# Patient Record
Sex: Female | Born: 1980 | Race: White | Hispanic: No | Marital: Single | Smoking: Never smoker
Health system: Southern US, Community
[De-identification: ages and names within clinical notes are randomized; demographics above are authoritative.]

## PROBLEM LIST (undated history)

## (undated) DIAGNOSIS — K219 Gastro-esophageal reflux disease without esophagitis: Secondary | ICD-10-CM

## (undated) HISTORY — PX: TUBAL LIGATION: SHX77

---

## 1999-03-25 ENCOUNTER — Emergency Department (HOSPITAL_COMMUNITY): Admission: EM | Admit: 1999-03-25 | Discharge: 1999-03-25 | Payer: Self-pay | Admitting: Emergency Medicine

## 1999-04-01 ENCOUNTER — Other Ambulatory Visit: Admission: RE | Admit: 1999-04-01 | Discharge: 1999-04-01 | Payer: Self-pay | Admitting: Obstetrics

## 1999-06-23 ENCOUNTER — Inpatient Hospital Stay (HOSPITAL_COMMUNITY): Admission: AD | Admit: 1999-06-23 | Discharge: 1999-06-23 | Payer: Self-pay | Admitting: Obstetrics

## 1999-09-02 ENCOUNTER — Inpatient Hospital Stay (HOSPITAL_COMMUNITY): Admission: AD | Admit: 1999-09-02 | Discharge: 1999-09-02 | Payer: Self-pay | Admitting: Obstetrics

## 1999-09-30 ENCOUNTER — Inpatient Hospital Stay (HOSPITAL_COMMUNITY): Admission: AD | Admit: 1999-09-30 | Discharge: 1999-10-03 | Payer: Self-pay | Admitting: Obstetrics

## 1999-09-30 ENCOUNTER — Inpatient Hospital Stay (HOSPITAL_COMMUNITY): Admission: AD | Admit: 1999-09-30 | Discharge: 1999-09-30 | Payer: Self-pay | Admitting: Obstetrics

## 2000-06-02 ENCOUNTER — Emergency Department (HOSPITAL_COMMUNITY): Admission: EM | Admit: 2000-06-02 | Discharge: 2000-06-02 | Payer: Self-pay | Admitting: Emergency Medicine

## 2000-06-12 ENCOUNTER — Emergency Department (HOSPITAL_COMMUNITY): Admission: EM | Admit: 2000-06-12 | Discharge: 2000-06-12 | Payer: Self-pay | Admitting: Emergency Medicine

## 2000-06-12 ENCOUNTER — Encounter: Payer: Self-pay | Admitting: Emergency Medicine

## 2000-09-11 ENCOUNTER — Emergency Department (HOSPITAL_COMMUNITY): Admission: EM | Admit: 2000-09-11 | Discharge: 2000-09-12 | Payer: Self-pay | Admitting: Emergency Medicine

## 2000-12-05 ENCOUNTER — Inpatient Hospital Stay (HOSPITAL_COMMUNITY): Admission: AD | Admit: 2000-12-05 | Discharge: 2000-12-05 | Payer: Self-pay | Admitting: Obstetrics

## 2001-01-24 ENCOUNTER — Emergency Department (HOSPITAL_COMMUNITY): Admission: EM | Admit: 2001-01-24 | Discharge: 2001-01-24 | Payer: Self-pay

## 2001-01-24 ENCOUNTER — Encounter: Payer: Self-pay | Admitting: Emergency Medicine

## 2001-02-15 ENCOUNTER — Ambulatory Visit: Admission: RE | Admit: 2001-02-15 | Discharge: 2001-02-15 | Payer: Self-pay | Admitting: Gynecologic Oncology

## 2001-03-17 ENCOUNTER — Emergency Department (HOSPITAL_COMMUNITY): Admission: EM | Admit: 2001-03-17 | Discharge: 2001-03-17 | Payer: Self-pay | Admitting: Emergency Medicine

## 2001-03-17 ENCOUNTER — Encounter: Payer: Self-pay | Admitting: *Deleted

## 2001-06-15 ENCOUNTER — Inpatient Hospital Stay (HOSPITAL_COMMUNITY): Admission: AD | Admit: 2001-06-15 | Discharge: 2001-06-15 | Payer: Self-pay | Admitting: Obstetrics

## 2001-06-26 ENCOUNTER — Inpatient Hospital Stay (HOSPITAL_COMMUNITY): Admission: AD | Admit: 2001-06-26 | Discharge: 2001-06-26 | Payer: Self-pay | Admitting: Obstetrics

## 2001-07-11 ENCOUNTER — Inpatient Hospital Stay (HOSPITAL_COMMUNITY): Admission: AD | Admit: 2001-07-11 | Discharge: 2001-07-11 | Payer: Self-pay | Admitting: Obstetrics

## 2001-07-13 ENCOUNTER — Inpatient Hospital Stay (HOSPITAL_COMMUNITY): Admission: AD | Admit: 2001-07-13 | Discharge: 2001-07-16 | Payer: Self-pay | Admitting: Obstetrics

## 2001-11-08 ENCOUNTER — Ambulatory Visit (HOSPITAL_COMMUNITY): Admission: RE | Admit: 2001-11-08 | Discharge: 2001-11-08 | Payer: Self-pay | Admitting: Obstetrics

## 2001-11-08 ENCOUNTER — Encounter (INDEPENDENT_AMBULATORY_CARE_PROVIDER_SITE_OTHER): Payer: Self-pay | Admitting: *Deleted

## 2003-02-21 ENCOUNTER — Inpatient Hospital Stay (HOSPITAL_COMMUNITY): Admission: AD | Admit: 2003-02-21 | Discharge: 2003-02-22 | Payer: Self-pay | Admitting: Obstetrics

## 2003-04-02 ENCOUNTER — Inpatient Hospital Stay (HOSPITAL_COMMUNITY): Admission: AD | Admit: 2003-04-02 | Discharge: 2003-04-02 | Payer: Self-pay | Admitting: Obstetrics

## 2003-04-15 ENCOUNTER — Inpatient Hospital Stay (HOSPITAL_COMMUNITY): Admission: AD | Admit: 2003-04-15 | Discharge: 2003-04-15 | Payer: Self-pay | Admitting: Obstetrics

## 2003-04-27 ENCOUNTER — Inpatient Hospital Stay (HOSPITAL_COMMUNITY): Admission: AD | Admit: 2003-04-27 | Discharge: 2003-04-28 | Payer: Self-pay | Admitting: Obstetrics

## 2003-05-09 ENCOUNTER — Inpatient Hospital Stay (HOSPITAL_COMMUNITY): Admission: AD | Admit: 2003-05-09 | Discharge: 2003-05-09 | Payer: Self-pay | Admitting: Obstetrics

## 2003-05-15 ENCOUNTER — Inpatient Hospital Stay (HOSPITAL_COMMUNITY): Admission: RE | Admit: 2003-05-15 | Discharge: 2003-05-18 | Payer: Self-pay | Admitting: Obstetrics

## 2004-05-06 ENCOUNTER — Emergency Department (HOSPITAL_COMMUNITY): Admission: EM | Admit: 2004-05-06 | Discharge: 2004-05-06 | Payer: Self-pay | Admitting: Emergency Medicine

## 2005-01-20 ENCOUNTER — Emergency Department (HOSPITAL_COMMUNITY): Admission: EM | Admit: 2005-01-20 | Discharge: 2005-01-20 | Payer: Self-pay | Admitting: Emergency Medicine

## 2005-02-06 ENCOUNTER — Ambulatory Visit: Payer: Self-pay | Admitting: Family Medicine

## 2006-07-29 DIAGNOSIS — R1013 Epigastric pain: Secondary | ICD-10-CM

## 2006-07-29 DIAGNOSIS — K3189 Other diseases of stomach and duodenum: Secondary | ICD-10-CM

## 2006-07-29 DIAGNOSIS — E669 Obesity, unspecified: Secondary | ICD-10-CM

## 2007-08-31 ENCOUNTER — Inpatient Hospital Stay (HOSPITAL_COMMUNITY): Admission: AD | Admit: 2007-08-31 | Discharge: 2007-08-31 | Payer: Self-pay | Admitting: Obstetrics

## 2007-09-12 ENCOUNTER — Inpatient Hospital Stay (HOSPITAL_COMMUNITY): Admission: RE | Admit: 2007-09-12 | Discharge: 2007-09-15 | Payer: Self-pay | Admitting: Obstetrics

## 2007-09-12 ENCOUNTER — Encounter (INDEPENDENT_AMBULATORY_CARE_PROVIDER_SITE_OTHER): Payer: Self-pay | Admitting: Obstetrics

## 2008-02-28 ENCOUNTER — Encounter (INDEPENDENT_AMBULATORY_CARE_PROVIDER_SITE_OTHER): Payer: Self-pay | Admitting: General Surgery

## 2008-02-28 ENCOUNTER — Ambulatory Visit (HOSPITAL_COMMUNITY): Admission: EM | Admit: 2008-02-28 | Discharge: 2008-02-29 | Payer: Self-pay | Admitting: Emergency Medicine

## 2009-11-25 ENCOUNTER — Inpatient Hospital Stay (HOSPITAL_COMMUNITY): Admission: RE | Admit: 2009-11-25 | Discharge: 2009-11-25 | Payer: Self-pay | Admitting: Obstetrics

## 2009-11-30 ENCOUNTER — Emergency Department (HOSPITAL_COMMUNITY): Admission: EM | Admit: 2009-11-30 | Discharge: 2009-11-30 | Payer: Self-pay | Admitting: Emergency Medicine

## 2010-08-18 LAB — GC/CHLAMYDIA PROBE AMP, GENITAL
Chlamydia, DNA Probe: NEGATIVE
GC Probe Amp, Genital: NEGATIVE

## 2010-08-18 LAB — URINALYSIS, ROUTINE W REFLEX MICROSCOPIC
Glucose, UA: NEGATIVE mg/dL
Ketones, ur: NEGATIVE mg/dL
Leukocytes, UA: NEGATIVE
Specific Gravity, Urine: >1.03 — ABNORMAL HIGH (ref 1.005–1.030)
Urobilinogen, UA: 0.2 mg/dL (ref 0.0–1.0)

## 2010-08-18 LAB — CBC
HCT: 37.9 % (ref 36.0–46.0)
Hemoglobin: 12.6 g/dL (ref 12.0–15.0)
MCH: 28.7 pg (ref 26.0–34.0)
MCHC: 33.3 g/dL (ref 30.0–36.0)
MCV: 86.2 fL (ref 78.0–100.0)
Platelets: 291 K/uL (ref 150–400)
RBC: 4.4 MIL/uL (ref 3.87–5.11)
RDW: 14.7 % (ref 11.5–15.5)
WBC: 6.8 K/uL (ref 4.0–10.5)

## 2010-08-18 LAB — URINE MICROSCOPIC-ADD ON

## 2010-08-18 LAB — WET PREP, GENITAL
Clue Cells Wet Prep HPF POC: NONE SEEN
Trich, Wet Prep: NONE SEEN
Yeast Wet Prep HPF POC: NONE SEEN

## 2010-08-18 LAB — POCT PREGNANCY, URINE: Preg Test, Ur: NEGATIVE

## 2010-10-14 NOTE — Op Note (Signed)
NAMEMARLAYSIA, Meredith Lucas           ACCOUNT NO.:  [id number]   MEDICAL RECORD NO.:  [id number]          PATIENT TYPE:  OBV   LOCATION:  1604                         FACILITY:  [facility]   PHYSICIAN:  Angelia Mould. Derrell Lolling, M.D.DATE OF BIRTH:  10-12-1980   DATE OF PROCEDURE:  02/28/2008  DATE OF DISCHARGE:                               OPERATIVE REPORT   PREOPERATIVE DIAGNOSES:  Acute cholecystitis with cholelithiasis.   POSTOPERATIVE DIAGNOSES:  Acute, edematous cholecystitis with  cholelithiasis.   OPERATION PERFORMED:  Laparoscopic cholecystectomy with intraoperative  cholangiogram.   SURGEON:  Dr. Claud Kelp.   FIRST ASSISTANT:  Dr. Glenna Fellows.   OPERATIVE INDICATIONS:  This is a 30 year old white female who presented  with a 24-hour history of back pain, epigastric pain, flank pain and  nausea.  She came to [facility] emergency room where an ultrasound was  obtained which showed gallstones and a thickened gallbladder wall.  She  had mild elevations of SGOT and SGPT.  I was asked to see her.  I found  that she had mild abdominal tenderness.  She wanted to come in and have  her gallbladder surgery at this time, and so she was admitted, given  intravenous antibiotics and brought to the operating room.   OPERATIVE FINDINGS:  The gallbladder was clearly edematous and acutely  inflamed, but there was no exudate, there was no gangrene.  The anatomy  of the cystic duct, cystic artery and common bile duct were normal.  The  cholangiogram was normal, showing normal intrahepatic and extrahepatic  bile ducts, no filling defect, and no obstruction with good flow of  contrast into the duodenum.  The liver, stomach, duodenum, colon and  small bowel looked normal to inspection.   OPERATIVE TECHNIQUE:  Following the induction of general endotracheal  anesthesia, the patient's abdomen was prepped and draped in a sterile  fashion.  The patient was identified as the correct patient and  correct  procedure.  Intravenous antibiotics had been given preop.  Marcaine 0.5%  with epinephrine was used as a local infiltration anesthetic.  A  vertically oriented incision was made inside the lower rim of the  umbilicus.  The fascia was incised in the midline.  The abdominal cavity  entered under direct vision.  An 11 mm Hassan trocar was inserted and  secured with a pursestring suture of 0 Vicryl.  Pneumoperitoneum was  created.  Video camera was inserted with visualization and findings as  described above.  An 11-mm trocar was placed in subxiphoid region and  two 5-mm trocars placed in the right upper quadrant.  The gallbladder  was identified and grasped.  We were able to retract the duodenum  inferiorly and identify the infundibulum of the gallbladder.  The  gallbladder was notable for edema, but very few adhesions.  We dissected  out the cystic duct and the cystic artery.  We were able to dissect out  a fairly significant length of cystic duct and have a good critical  view.  We inserted a cholangiogram catheter into the cystic duct and we  obtained a cholangiogram with the C-arm.  The  cholangiogram was normal  as described above.  The cholangiogram catheter was removed, the cystic  duct was secured with multiple metal clips and divided.  We isolated the  cystic artery as follows.  We found a common trunk which then branched  into an anterior branch and a posterior branch.  We could identify both  branches going onto the gallbladder, secured them with metal clips.  We  then clipped the proximal common branch and divided everything.  We then  dissected the gallbladder from its bed with electrocautery, placed it in  a specimen bag and removed it.  The operative field and the subphrenic  space were irrigated with about 1000 mL of saline.  There was no  bleeding and no bile whatsoever at the then of the case.  Irrigation  fluid was completely cleared and removed.  The trocars were  removed  under direct vision.  There was no bleeding from trocar sites.  The  pneumoperitoneum was released.  The fascia at the umbilicus was closed  with 0 Vicryl sutures.  The skin incisions were closed with subcuticular  sutures of 4-0 Monocryl and Steri-Strips.  Clean bandages were placed,  and the patient taken recovery room in stable condition.  Estimated  blood loss about 10 mL.  Complications none.  Sponge, needle and  instrument counts were correct.      Angelia Mould. Derrell Lolling, M.D.  Electronically Signed     HMI/MEDQ  D:  02/28/2008  T:  02/29/2008  Job:  [id number]

## 2010-10-14 NOTE — H&P (Signed)
Meredith Lucas, Meredith Lucas           ACCOUNT NO.:  [id number]   MEDICAL RECORD NO.:  [id number]          PATIENT TYPE:  OBV   LOCATION:  0105                         FACILITY:  [facility]   PHYSICIAN:  Angelia Mould. Derrell Lolling, M.D.DATE OF BIRTH:  16-Dec-1980   DATE OF ADMISSION:  02/28/2008  DATE OF DISCHARGE:                              HISTORY & PHYSICAL   CHIEF COMPLAINT:  Abdominal pain, nausea and gallstones.   HISTORY OF PRESENT ILLNESS:  This is a 30 year old white female who  states that yesterday she developed back pain and nausea but has not  vomited.  Late last night she developed epigastric pain radiating in the  right upper quadrant and left upper quadrant.  This would occur in ways  that would get worse and better but has continued to wax and wane to the  present time.  No prior episodes.  No fever.  Denies any history of  liver disease or jaundice.  Denies any history of cardiovascular or  pulmonary disease except for mild asthma as a child.  No urologic  history.   She was evaluated by Dr. Carleene Cooper in the emergency room.  A  gallbladder ultrasound shows tiny gallstones and some gallbladder wall  thickening at 3.4 mm but is otherwise unremarkable ultrasound.  Dr.  Ignacia Palma asked me to evaluate the patient.  The patient is interested in  having a cholecystectomy at this time and is being admitted for that  procedure.   PAST HISTORY:  Four pregnancies, four deliveries.  Three is cesarean  sections.  Otherwise no medical or surgical problems.   CURRENT MEDICATIONS:  None.   DRUG ALLERGIES:  None known.   SOCIAL HISTORY:  The patient lives in [place].  She is single.  Her  boyfriend is with her.  She has 1 of her babies with her.  She lives  with her boyfriend.  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  She denies alcohol or tobacco.   FAMILY HISTORY:  Father living with COPD,  hypertension, hyperlipidemia  and brown lung.  Mother living COPD and esophageal reflux disease and  has had a hiatal hernia surgery.  One brother who has psychiatric  problems and has COPD.  One sister living and well.   REVIEW OF SYSTEMS:  A 15-system review of systems is performed and is  noncontributory except as described above.   PHYSICAL EXAMINATION:  Obese white female in minimal distress, alert and  cooperative.  Temperature 97.3.  Pulse 89.  Respirations 18.  Blood pressure 112/65.  EYES:  Sclerae are clear.  Extraocular movements intact.  EARS, NOSE, MOUTH, THROAT:  Nose, lips, tongue and oropharynx without  gross lesions.  NECK:  Supple, nontender.  No mass.  No jugular distention.  LUNGS:  Clear to auscultation.  No chest wall tenderness.  HEART:  Regular rate and rhythm.  No murmur.  Radial and dorsalis pedis  pulses are palpable.  ABDOMEN:  Obese.  Soft.  Mildly tender in the right upper quadrant and  to a  lesser extent the epigastrium and left upper quadrant.  There is no  guarding or mass.  There is no hernia.  There is a well-healed  Pfannenstiel scar.  Liver and spleen not enlarged.  EXTREMITIES:  She moves all 4 extremities well without pain or  deformity.  NEUROLOGIC:  No gross motor sensory deficits.   ADMISSION DATA:  Ultrasound is described above showing gallstones and  gallbladder wall thickening.  Hemoglobin 12.5, white blood cell count  10,400, white blood cell count differential normal.  Liver function  tests showed SGOT elevated to 87 and SGPT borderline at 41, otherwise  liver function tests are normal.  Serum lipase is 21 which is normal.  Urine pregnancy test is negative.  Urinalysis is normal.   ASSESSMENT:  1. Acute and chronic cholecystitis with cholelithiasis.  I think she      has mild acute inflammation of the gallbladder and has continuing      to have pain and would benefit from cholecystectomy.  2. Obesity.  3. Multiparity.   PLAN:   1. The patient will be admitted to the hospital, started on IV fluids,      IV antibiotics and analgesics.  2. We will plan to take her to the operating room within the next 12-      24 hours for laparoscopic cholecystectomy.   I have discussed the indications and details of surgery with her.  Risks  and complications have been outlined, including but not limited to  bleeding, infection, conversion to open laparotomy, injury to adjacent  organs such as the intestine or bile duct with major reconstructive  surgery, bile leak, cardiac, pulmonary and thromboembolic problems.  She  seems to understand these issues well.  At this time all of her  questions are answered.  She is in full agreement with this plan.      Angelia Mould. Derrell Lolling, M.D.  Electronically Signed     HMI/MEDQ  D:  02/28/2008  T:  02/28/2008  Job:  [id number]   cc:   Kathreen Cosier, M.D.  Fax: [phone]

## 2010-10-14 NOTE — Op Note (Signed)
NAMEPRINCE, Meredith           ACCOUNT NO.:  [id number]   MEDICAL RECORD NO.:  [id number]          PATIENT TYPE:  INP   LOCATION:  9106                          FACILITY:  [facility]   PHYSICIAN:  Kathreen Cosier, M.D.DATE OF BIRTH:  [birth year]   DATE OF PROCEDURE:  09/12/2007  DATE OF DISCHARGE:                               OPERATIVE REPORT   PREOPERATIVE DIAGNOSIS:  Previous cesarean section x2 at term, desires  repeat and tubal ligation.   POSTOPERATIVE DIAGNOSIS:  Previous cesarean section x2 at term, desires  repeat and tubal ligation.   SURGEON:  Kathreen Cosier, M.D.   FIRST ASSISTANT:  Dr. Tamela Oddi.   PROCEDURE:  The patient placed in the operating table in supine position  after the spinal administered.  Abdomen prepped and draped.  Bladder  emptied with Foley catheter.  Transverse suprapubic incision made and  carried down to rectus fascia.  Fascia cleaned and incised the length of  the incision.  Recti muscles retracted laterally.  Peritoneum incised  longitudinally.  Transverse incision made above the visceral peritoneum  above the bladder.  Bladder mobilized inferiorly.  Transverse lower  uterine incision made.  The fluid was clear.  The patient delivered from  a female Apgar 9/9, weighing 7 pounds 9 ounces.  The team was in  attendance.  The placenta was anterior removed manually and sent to  labor and delivery.  Uterine cavity cleaned with dry laps.  The uterine  incision closed in one layer with continuous suture of #1 chromic.  Hemostasis satisfactory.  Bladder flap reattached with 2-0 chromic.  Uterus well contracted.  Tubes and ovaries normal.  The right tube  grasped in the midportion with a Babcock clamp.  0 plain suture placed  in the mesosalpinx below the portion of tube within the clamp.  This was  tied and approximately 1 inch of tube transected.  Procedure done in a  similar fashion the other side.  Lap and sponge counts correct.  Abdomen  closed in layers, peritoneum continuous suture of 0 chromic, fascia  continuous suture of 0 Dexon and the skin closed with subcuticular  stitch of 4-0 Monocryl.  Blood loss 700 mL.  The patient tolerated the  procedure well, taken to recovery room in good condition.           ______________________________  Kathreen Cosier, M.D.     BAM/MEDQ  D:  09/12/2007  T:  09/12/2007  Job:  [id number]

## 2010-10-17 NOTE — Discharge Summary (Signed)
[facility]  Patient:    Meredith Lucas, Meredith Lucas Visit Number: [id number] MRN: [id number]          Service Type: OBS Location: 910A 9134 01 Attending Physician:  Venita Sheffield Dictated by:   Kathreen Cosier, M.D. Admit Date:  07/13/2001 Discharge Date: 07/16/2001                             Discharge Summary  BRIEF HISTORY:                The patient is a 30 year old Gravida 2, Para 1, O-1, [facility] July 27, 2001, admitted in early labor with ruptured membranes at 1:30 p.m.  Exam suggested a breech, confirmed by ultrasound.  The patient was 3 cm long, footling breech, minus 3 station.  She was contracting and it was decided she would deliver by C-section.  She underwent a primary low transverse cesarean section delivering a female, Apgar 8, 9, weighing 6.5 pounds, double footling breech, anterior placenta.  On admission her hemoglobin was 9.9, postoperatively 8.1.  She did well postoperatively and was discharged home on the third postoperative day ambulatory, regular diet.  DISCHARGE DIAGNOSIS:          Status post intrauterine pregnancy at term, breech presentation, premature ruptured membranes labor, primary low transverse cesarean section. Dictated by:   Kathreen Cosier, M.D. Attending Physician:  Venita Sheffield DD:  07/16/01 TD:  07/16/01 Job: [id number]

## 2010-10-17 NOTE — Discharge Summary (Signed)
NAMEMarland Kitchen  MACLAINE, AHOLA                     ACCOUNT NO.:  [id number]   MEDICAL RECORD NO.:  [id number]                   PATIENT TYPE:  INP   LOCATION:  9112                                 FACILITY:  [facility]   PHYSICIAN:  Kathreen Cosier, M.D.           DATE OF BIRTH:  01-24-1981   DATE OF ADMISSION:  05/15/2003  DATE OF DISCHARGE:  05/18/2003                                 DISCHARGE SUMMARY   HOSPITAL COURSE:  The patient is a 30 year old female at term who had a  previous C-section and desired repeat.  She underwent a repeat low  transverse cesarean section and had a female Apgars 8 and 9 weighing 7  pounds.  Blood loss was 500 mL.  She did well.  Her RPR was negative.  On  admission her hemoglobin was 9.4, postop 7.3, platelets 243, 196 and  electrolytes normal.  She was discharged on the third postoperative day and  to return to her regular diet, Motrin liquid, and Depo-Provera 150 mg IM to  see me in six weeks.   DISCHARGE DIAGNOSIS:  Status post repeat low transverse cesarean section at  term.                                               Kathreen Cosier, M.D.    BAM/MEDQ  D:  05/18/2003  T:  05/18/2003  Job:  [id number]

## 2010-10-17 NOTE — Op Note (Signed)
[facility]  Patient:    Meredith Lucas, Meredith Lucas Visit Number: [id number] MRN: [id number]          Service Type: OBS Location: 910A 9134 01 Attending Physician:  Venita Sheffield Dictated by:   Kathreen Cosier, M.D. Proc. Date: 07/13/01 Admit Date:  07/13/2001 Discharge Date: 07/16/2001                             Operative Report  REDICTATION  PREOPERATIVE DIAGNOSES:       1. Intrauterine pregnancy at term, in labor.                               2. Ruptured membranes.                               3. Breech presentation.  POSTOPERATIVE DIAGNOSES:      1. Intrauterine pregnancy at term, in labor.                               2. Ruptured membranes.                               3. Breech presentation.  OPERATION:  SURGEON:                      Kathreen Cosier, M.D.  ANESTHESIA:                   Spinal.  DESCRIPTION OF PROCEDURE:     The patient placed on the operating table in the supine position after the spinal administered.  The abdomen prepped and draped.  The bladder emptied with a Foley catheter.  A transverse suprapubic incision was made and carried down to the rectus fascia.  The fascia cleaned and incised the length of the incision.  The recti muscles retracted laterally and the peritoneum incised longitudinally.  A transverse incision was made over the visceroperitoneum above the bladder and the bladder mobilized inferiorly.  A transverse lower uterine incision was made and the patient delivered of a female.  Apgar 8 and 9, weighing 6 pounds and 5 ounces.  Double footling breech presentation.  The team was in attendance.  The placenta was removed manually and the uterine cavity cleaned with dry laps.  The uterine incision closed in one layer with continuous suture of #1 chromic.  Hemostasis was satisfactory.  The bladder flap reattached with 2-0 chromic.  The uterus was well-contracted, tubes and ovaries normal.   The abdomen closed in layers. The peritoneum with continuous suture of 0 chromic.  The fascia continuous suture of 0 Dexon and the skin closed with subcuticular stitch of 3-0 Monocryl.  Blood loss was 450 cc. Dictated by:   Kathreen Cosier, M.D. Attending Physician:  Venita Sheffield DD:  08/03/01 TD:  08/03/01 Job: [id number]

## 2010-10-17 NOTE — Consult Note (Signed)
[facility]  Patient:    Meredith Lucas, Meredith Lucas Visit Number: [id number] MRN: [id number]          Service Type: GON Location: GYN Attending Physician:  Sabino Donovan Dictated by:   Jackquline Denmark. Kyla Balzarine, M.D. Proc. Date: 02/15/01 Admit Date:  02/15/2001   CC:         Kathreen Cosier, M.D.  Telford Nab, R.N.   Consultation Report  REASON FOR CONSULTATION:  Cervical intraepithelial neoplasia during pregnancy.  HISTORY OF PRESENT ILLNESS:  The patient is a 30 year old at approximately 15 weeks intrauterine pregnancy by ultrasound in July who had Pap smear suggesting CIN 2/HSIL at her first prenatal visit.  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  The patient has been entirely asymptomatic and denies abnormal Pap smears in the past.  She has a single sexual partner, is a para 1 0-0-1 and denies prior STD or condylomata other than treatment for chlamydia in the past.  She denies tobacco use.  PAST MEDICAL HISTORY:  No other comorbidities.  MEDICATIONS:  Prenatal vitamins.  ALLERGIES:  NONE.  PERSONAL/SOCIAL HISTORY:  Married, denies tobacco, rare ethanol.  FAMILY HISTORY:  Noncontributory.  REVIEW OF SYSTEMS:  As above.  PHYSICAL EXAMINATION:  VITAL SIGNS:  Weight 194 pounds.  GENERAL:  The patient is alert and oriented x 3 in no acute distress.  ABDOMEN:  Soft and benign with uterine fundus palpable with approximately three quarters the way between symphysis and umbilicus.  There is no ascites or mass and no abdominal tenderness.  PELVIC:  External genitalia and BUS are normal to inspection and palpation. The bladder and urethra are well supported.  Vagina is clear without lesions. The cervix is slightly cyanotic and everted without gross lesions.  Bimanual examination reveals normal cervical consistency in 16-17 week size uterus without adnexal pathology.  PROCEDURE:  Note after verbal informed consent,  colposcopy is obtained revealing abundant mucoid discharge from endocervical glands.  There is a modest amount of cervical eversion with acetowhite epithelium anteriorly and posteriorly with scattered punctation.  No atypical vessels are seen suggesting invasive disease.  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  RECOMMENDATIONS:  I had a lengthy discussion with the patient.  Her risk for malignant transformation or progression during this pregnancy is minimal.  I would recommend repeating Pap smears at approximately 24 and 32 weeks and would not intervene unless she has Pap smears suggesting cancer.  If she continues to have Pap smears suggesting CIN during pregnancy, I would reevaluate her at eight weeks postpartum with colposcopy and biopsies; this could be done under Dr. Tawny Hopping ________ and we could see her back at any time on a p.r.n. basis. Dictated by:   Jackquline Denmark. Kyla Balzarine, M.D. Attending Physician:  Ronita Hipps T DD:  02/15/01 TD:  02/15/01 Job: [id number]

## 2010-10-17 NOTE — Op Note (Signed)
NAMEJERALINE, Meredith Lucas                     ACCOUNT NO.:  [id number]   MEDICAL RECORD NO.:  [id number]                   PATIENT TYPE:  INP   LOCATION:  9112                                 FACILITY:  [facility]   PHYSICIAN:  Kathreen Cosier, M.D.           DATE OF BIRTH:  [birth year]   DATE OF PROCEDURE:  05/15/2003  DATE OF DISCHARGE:                                 OPERATIVE REPORT   PREOPERATIVE DIAGNOSIS:  Previous cesarean section at term, desires repeat.   ANESTHESIA:  Spinal.   SURGEON:  Kathreen Cosier, M.D.   FIRST ASSISTANT:  Charles A. Clearance Coots, M.D.   DESCRIPTION OF PROCEDURE:  The patient placed on the operating table in the  supine position.  Abdomen prepped and draped.  Bladder emptied with Foley  catheter.  A transverse suprapubic incision made through the old scar,  carried down to the rectus fascia.  Fascial tenon incised the length of the  incision.  Recti muscles were retracted laterally.  Peritoneum incised  longitudinally.  Transverse incision made in the visceral peritoneum above  the bladder. The bladder mobilized inferior.  A transverse lower uterine  incision made.  Fluid was clear.  Patient delivered from the OP position of  a female with Apgars 8 and 9 weighing 7 pounds.  Team was in attendance.  The placenta was posterior, removed manually with uterine cavity cleaned  with dry laps.  Uterine incision closed in one layer with continuous suture  of #1 chromic.  Fascia closed with continuous suture of 0 Dexon.  The skin  was closed with subcuticular stitch of 4-0 Monocryl.  Blood loss was 500 mL.  The patient tolerated the procedure well and was taken to the recovery room  in good condition.                                               Kathreen Cosier, M.D.    BAM/MEDQ  D:  05/15/2003  T:  05/15/2003  Job:  [id number]

## 2010-10-17 NOTE — Op Note (Signed)
[facility]  Patient:    Meredith Lucas, Meredith Lucas Visit Number: [id number] MRN: [id number]          Service Type: DSU Location: [facility] Attending Physician:  Venita Sheffield Dictated by:   Kathreen Cosier, M.D. Proc. Date: 11/08/01 Admit Date:  11/08/2001 Discharge Date: 11/08/2001                             Operative Report  PREOPERATIVE DIAGNOSES:       Severe dysplasia of the cervix.  PROCEDURE:                    Cold knife conization.  Under general anesthesia with patient in lithotomy position, perineum and vagina prepped and draped. Bladder emptied with straight catheter.  A suture of #1 figure-of-eight was placed at 3 and 9 oclock high upon the lateral aspect of the cervix for hemostasis.  Cold knife conization was done in the usual manner and then hemostasis was achieved with U sutures placed on the anterior and posterior aspects of the cervix.  The cervical canal was noted to be open.  The endometrial cavity sounded 8 cm.  Patient tolerated her procedure well.  Taken to recovery room in good condition. Dictated by:   Kathreen Cosier, M.D. Attending Physician:  Venita Sheffield DD:  11/08/01 TD:  11/09/01 Job: [id number]

## 2010-10-17 NOTE — Discharge Summary (Signed)
Meredith Lucas, Meredith Lucas           ACCOUNT NO.:  [id number]   MEDICAL RECORD NO.:  [id number]          PATIENT TYPE:  INP   LOCATION:  9103                          FACILITY:  [facility]   PHYSICIAN:  Kathreen Cosier, M.D.DATE OF BIRTH:  1980-06-27   DATE OF ADMISSION:  09/12/2007  DATE OF DISCHARGE:  09/15/2007                               DISCHARGE SUMMARY   The patient is a 30 year old gravida 4, para 3-0-0-3, [facility] September 19, 2007.  She had two previous C-sections and she is now at term and  desired repeat C-section, tubal ligation, negative HIV, and negative  GBS.   She was allergic to CODEINE and SULFA.   Negative family history.   The patient underwent a repeat low transverse cesarean section and tubal  ligation.  She had a female Apgar 9 and 9, weighing 7 pounds and 9  ounces.  Postoperatively, her hemoglobin was 8.2.  She was given ferrous  sulfate one p.o. b.i.d.  She did well and was discharged on the third  postoperative day, ambulatory on a regular diet on Tylox for pain and  Ferralet 90 one p.o. daily for her anemia.   DISCHARGE DIAGNOSIS:  Status post repeat low transverse cesarean section  and tubal ligation at term.           ______________________________  Kathreen Cosier, M.D.     BAM/MEDQ  D:  09/28/2007  T:  09/28/2007  Job:  [id number]

## 2011-02-13 ENCOUNTER — Emergency Department (HOSPITAL_COMMUNITY): Payer: Self-pay

## 2011-02-13 ENCOUNTER — Emergency Department (HOSPITAL_COMMUNITY)
Admission: EM | Admit: 2011-02-13 | Discharge: 2011-02-13 | Disposition: A | Discharge status: Home / Self Care | Payer: Self-pay | Attending: Emergency Medicine | Admitting: Emergency Medicine

## 2011-02-13 DIAGNOSIS — Z23 Encounter for immunization: Secondary | ICD-10-CM | POA: Insufficient documentation

## 2011-02-13 DIAGNOSIS — S6710XA Crushing injury of unspecified finger(s), initial encounter: Secondary | ICD-10-CM | POA: Insufficient documentation

## 2011-02-13 DIAGNOSIS — W230XXA Caught, crushed, jammed, or pinched between moving objects, initial encounter: Secondary | ICD-10-CM | POA: Insufficient documentation

## 2011-02-13 DIAGNOSIS — S6000XA Contusion of unspecified finger without damage to nail, initial encounter: Secondary | ICD-10-CM | POA: Insufficient documentation

## 2011-02-24 LAB — CBC
HCT: 29.3 — ABNORMAL LOW
MCV: 80.7
Platelets: 198
RBC: 3.02 — ABNORMAL LOW
RDW: 15

## 2011-02-24 LAB — CCBB MATERNAL DONOR DRAW

## 2011-03-02 LAB — COMPREHENSIVE METABOLIC PANEL
AST: 87 — ABNORMAL HIGH
Albumin: 3.4 — ABNORMAL LOW
Alkaline Phosphatase: 84
BUN: 8
CO2: 28
Calcium: 9.7
Chloride: 103
Creatinine, Ser: 0.68
GFR calc Af Amer: >60
GFR calc non Af Amer: >60
Glucose, Bld: 103 — ABNORMAL HIGH
Sodium: 139
Total Protein: 7.2

## 2011-03-02 LAB — URINE CULTURE

## 2011-03-02 LAB — URINALYSIS, ROUTINE W REFLEX MICROSCOPIC: Urobilinogen, UA: 0.2

## 2011-03-02 LAB — DIFFERENTIAL
Lymphocytes Relative: 17
Monocytes Absolute: 0.7
Monocytes Relative: 6

## 2011-03-02 LAB — LIPASE, BLOOD: Lipase: 21

## 2011-03-02 LAB — CBC
HCT: 37.9
Hemoglobin: 12.5
MCHC: 33
Platelets: 306
RBC: 4.71
RDW: 15.3

## 2012-09-19 ENCOUNTER — Emergency Department (HOSPITAL_COMMUNITY)
Admission: EM | Admit: 2012-09-19 | Discharge: 2012-09-19 | Disposition: A | Discharge status: Home / Self Care | Payer: No Typology Code available for payment source | Attending: Emergency Medicine | Admitting: Emergency Medicine

## 2012-09-19 DIAGNOSIS — M7918 Myalgia, other site: Secondary | ICD-10-CM

## 2012-09-19 DIAGNOSIS — S0993XA Unspecified injury of face, initial encounter: Secondary | ICD-10-CM | POA: Insufficient documentation

## 2012-09-19 DIAGNOSIS — Y92410 Unspecified street and highway as the place of occurrence of the external cause: Secondary | ICD-10-CM | POA: Insufficient documentation

## 2012-09-19 DIAGNOSIS — Y9389 Activity, other specified: Secondary | ICD-10-CM | POA: Insufficient documentation

## 2012-09-19 MED ORDER — TRAMADOL HCL 50 MG PO TABS: 50.0000 mg | ORAL_TABLET | Freq: Four times a day (QID) | ORAL | Status: DC | PRN
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
## 2012-09-20 NOTE — ED Provider Notes (Signed)
Medical screening examination/treatment/procedure(s) were performed by non-physician practitioner and as supervising physician I was immediately available for consultation/collaboration.    Celene Kras, MD 09/20/12 1101

## 2012-11-25 ENCOUNTER — Emergency Department (HOSPITAL_COMMUNITY)
Admission: EM | Admit: 2012-11-25 | Discharge: 2012-11-26 | Disposition: A | Discharge status: Home / Self Care | Payer: Medicaid Other | Attending: Emergency Medicine | Admitting: Emergency Medicine

## 2012-11-25 ENCOUNTER — Encounter (HOSPITAL_COMMUNITY): Payer: Self-pay | Admitting: Family Medicine

## 2012-11-25 DIAGNOSIS — R509 Fever, unspecified: Secondary | ICD-10-CM | POA: Insufficient documentation

## 2012-11-25 DIAGNOSIS — R1110 Vomiting, unspecified: Secondary | ICD-10-CM | POA: Insufficient documentation

## 2012-11-25 DIAGNOSIS — Z9089 Acquired absence of other organs: Secondary | ICD-10-CM | POA: Insufficient documentation

## 2012-11-25 DIAGNOSIS — E876 Hypokalemia: Secondary | ICD-10-CM | POA: Insufficient documentation

## 2012-11-25 DIAGNOSIS — R197 Diarrhea, unspecified: Secondary | ICD-10-CM | POA: Insufficient documentation

## 2012-11-25 DIAGNOSIS — R1013 Epigastric pain: Secondary | ICD-10-CM | POA: Insufficient documentation

## 2012-11-25 DIAGNOSIS — E860 Dehydration: Secondary | ICD-10-CM | POA: Insufficient documentation

## 2012-11-25 DIAGNOSIS — Z3202 Encounter for pregnancy test, result negative: Secondary | ICD-10-CM | POA: Insufficient documentation

## 2012-11-25 LAB — COMPREHENSIVE METABOLIC PANEL
ALT: 20 U/L (ref 0–35)
AST: 35 U/L (ref 0–37)
Alkaline Phosphatase: 86 U/L (ref 39–117)
CO2: 20 mEq/L (ref 19–32)
GFR calc Af Amer: >90 mL/min (ref >90)
Glucose, Bld: 92 mg/dL (ref 70–99)
Potassium: 3 mEq/L — ABNORMAL LOW (ref 3.5–5.1)
Sodium: 135 mEq/L (ref 135–145)
Total Protein: 7.8 g/dL (ref 6.0–8.3)

## 2012-11-25 LAB — URINALYSIS, ROUTINE W REFLEX MICROSCOPIC
Leukocytes, UA: NEGATIVE
Nitrite: NEGATIVE
Specific Gravity, Urine: 1.034 — ABNORMAL HIGH (ref 1.005–1.030)
pH: 5.5 (ref 5.0–8.0)

## 2012-11-25 LAB — CBC WITH DIFFERENTIAL/PLATELET
Basophils Absolute: 0 10*3/uL (ref 0.0–0.1)
Eosinophils Absolute: 0.2 10*3/uL (ref 0.0–0.7)
Lymphocytes Relative: 14 % (ref 12–46)
Lymphs Abs: 1.7 10*3/uL (ref 0.7–4.0)
Neutrophils Relative %: 77 % (ref 43–77)
Platelets: 278 10*3/uL (ref 150–400)
RBC: 4.84 MIL/uL (ref 3.87–5.11)
WBC: 12.6 10*3/uL — ABNORMAL HIGH (ref 4.0–10.5)

## 2012-11-25 MED ORDER — SODIUM CHLORIDE 0.9 % IV BOLUS (SEPSIS)
1000.0000 mL | Freq: Once | INTRAVENOUS | Status: AC
  Administered 2012-11-26: 1000 mL via INTRAVENOUS

## 2012-11-25 MED ORDER — POTASSIUM CHLORIDE CRYS ER 20 MEQ PO TBCR
40.0000 meq | EXTENDED_RELEASE_TABLET | Freq: Once | ORAL | Status: AC
  Administered 2012-11-26: 40 meq via ORAL
  Filled 2012-11-25: qty 2

## 2012-11-25 NOTE — ED Provider Notes (Signed)
History    CSN: [id number] Arrival date & time 11/25/12  4098  First MD Initiated Contact with Patient 11/25/12 2245     Chief Complaint  Patient presents with  . Abdominal Pain  . Fever   (Consider location/radiation/quality/duration/timing/severity/associated sxs/prior Treatment) HPI  Meredith Lucas is a 32 y.o. female complaining of crampy epigastric abdominal pain intermittent, waxing and waning rated at 5/10 at baseline, 10 out of 10 at worst. Starting yesterday it is associated with fever (Tmax 101 today) pain radiates to back. It is associated with multiple episodes of nonbloody, non-dark diarrhea. Patient denies sick contacts. She had several episodes of nonbloody, nonbilious, no coffee ground emesis approximately 4 days ago. Tolerating by mouth at this time. Prior history of cholecystectomy, no other abdominal surgery. Patient took Av[place] powder home with no relief. She denies dysuria, frequency, abnormal vaginal discharge.  History reviewed. No pertinent past medical history. Past Surgical History  Procedure Laterality Date  . Cesarean section    . Tubal ligation     No family history on file. History  Substance Use Topics  . Smoking status: Never Smoker   . Smokeless tobacco: Not on file  . Alcohol Use: No   OB History   Grav Para Term Preterm Abortions TAB SAB Ect Mult Living                 Review of Systems  Constitutional:       Negative except as described in HPI  HENT:       Negative except as described in HPI  Respiratory:       Negative except as described in HPI  Cardiovascular:       Negative except as described in HPI  Gastrointestinal:       Negative except as described in HPI  Genitourinary:       Negative except as described in HPI  Musculoskeletal:       Negative except as described in HPI  Skin:       Negative except as described in HPI  Neurological:       Negative except as described in HPI  All other systems reviewed and are  negative.    Allergies  Review of patient's allergies indicates no known allergies.  Home Medications   Current Outpatient Rx  Name  Route  Sig  Dispense  Refill  . acetaminophen (TYLENOL) 500 MG tablet   Oral   Take 500 mg by mouth every 6 (six) hours as needed for pain or fever.         . Aspirin-Acetaminophen-Caffeine (GOODY HEADACHE PO)   Oral   Take 1 Package by mouth 2 (two) times daily as needed (pain, headache, or fever).         . Multiple Vitamin (MULTIVITAMIN WITH MINERALS) TABS   Oral   Take 1 tablet by mouth every morning.           BP 127/85  Pulse 108  Temp(Src) 98.1 F (36.7 C) (Oral)  Resp 20  SpO2 100%  LMP 11/09/2012 Physical Exam  Nursing note and vitals reviewed. Constitutional: She is oriented to person, place, and time. She appears well-developed and well-nourished. No distress.  HENT:  Head: Normocephalic.  Eyes: Conjunctivae and EOM are normal.  Neck: Normal range of motion.  Cardiovascular: Normal rate.   Pulmonary/Chest: Effort normal and breath sounds normal. No stridor. No respiratory distress. She has no wheezes. She has no rales. She exhibits no tenderness.  Abdominal:  Soft. Bowel sounds are normal. She exhibits no distension and no mass. There is no tenderness. There is no rebound and no guarding.  No tenderness to deep palpation of any quadrants, Rovsing, psoas, obturator sign are all negative.  Musculoskeletal: Normal range of motion.  Neurological: She is alert and oriented to person, place, and time.  Psychiatric: She has a normal mood and affect.    ED Course  Procedures (including critical care time) Labs Reviewed  URINALYSIS, ROUTINE W REFLEX MICROSCOPIC - Abnormal; Notable for the following:    APPearance CLOUDY (*)    Specific Gravity, Urine 1.034 (*)    Bilirubin Urine SMALL (*)    All other components within normal limits  CBC WITH DIFFERENTIAL  COMPREHENSIVE METABOLIC PANEL  LIPASE, BLOOD  POCT PREGNANCY,  URINE   No results found. 1. Epigastric abdominal pain   2. Diarrhea   3. Fever   4. Hypokalemia   5. Mild dehydration     MDM   Filed Vitals:   11/25/12 2018  BP: 127/85  Pulse: 108  Temp: 98.1 F (36.7 C)  TempSrc: Oral  Resp: 20  SpO2: 100%     Meredith Lucas is a 32 y.o. female of epigastric abdominal pain, multiple episodes of nonbloody diarrhea and fever onset yesterday. Abdominal exam is benign with no tenderness to deep palpation of any quadrant. Work shows mild leukocytosis of 12.6 and hypokalemia of 3.0. Patient will be repleted with 40 mEq by mouth. Urine specific gravity is elevated. She will be given fluid bolus. Urinalysis shows no abnormalities. I've had an extensive discussion with her about possible early appendicitis and return precautions. She has verbalized her understanding and appears reliable to followup.  Medications  sodium chloride 0.9 % bolus 1,000 mL (1,000 mLs Intravenous New Bag/Given 11/26/12 0015)  potassium chloride SA (K-DUR,KLOR-CON) CR tablet 40 mEq (40 mEq Oral Given 11/26/12 0014)    Pt is hemodynamically stable, appropriate for, and amenable to discharge at this time. Pt verbalized understanding and agrees with care plan. Outpatient follow-up and specific return precautions discussed.    New Prescriptions   FAMOTIDINE (PEPCID) 20 MG TABLET    Take 1 tablet (20 mg total) by mouth 2 (two) times daily.   LOPERAMIDE (IMODIUM) 2 MG CAPSULE    Take 1 capsule (2 mg total) by mouth 4 (four) times daily as needed for diarrhea or loose stools.   TRAMADOL (ULTRAM) 50 MG TABLET    Take 1 tablet (50 mg total) by mouth every 6 (six) hours as needed for pain.     Wynetta Emery, PA-C 11/26/12 [phone]

## 2012-11-25 NOTE — ED Notes (Signed)
Pt states yesterday started having mid abdominal pain, states constant dull pain w/ sharp pains at times, states has diarrhea throughout the night and today, nausea present, but denies vomiting.

## 2012-11-25 NOTE — ED Notes (Signed)
Patient states that she has had abdominal pain since yesterday. States she has had nausea, fever of 101 at 430pm and diarrhea. States that she took a Goody powder at 430pm. Denies dysuria.

## 2012-11-26 MED ORDER — FAMOTIDINE 20 MG PO TABS: 20.0000 mg | ORAL_TABLET | Freq: Two times a day (BID) | ORAL | Status: DC

## 2012-11-26 MED ORDER — TRAMADOL HCL 50 MG PO TABS: 50.0000 mg | ORAL_TABLET | Freq: Four times a day (QID) | ORAL | Status: DC | PRN

## 2012-11-26 MED ORDER — LOPERAMIDE HCL 2 MG PO CAPS: 2.0000 mg | ORAL_CAPSULE | Freq: Four times a day (QID) | ORAL | Status: DC | PRN

## 2012-11-26 NOTE — ED Provider Notes (Signed)
Medical screening examination/treatment/procedure(s) were performed by non-physician practitioner and as supervising physician I was immediately available for consultation/collaboration.  Lyanne Co, MD 11/26/12 [phone]

## 2014-08-20 ENCOUNTER — Encounter (HOSPITAL_COMMUNITY): Payer: Self-pay | Admitting: Emergency Medicine

## 2014-08-20 ENCOUNTER — Emergency Department (HOSPITAL_COMMUNITY): Payer: Medicaid Other

## 2014-08-20 ENCOUNTER — Emergency Department (HOSPITAL_COMMUNITY)
Admission: EM | Admit: 2014-08-20 | Discharge: 2014-08-21 | Disposition: A | Discharge status: Home / Self Care | Payer: Medicaid Other | Attending: Emergency Medicine | Admitting: Emergency Medicine

## 2014-08-20 DIAGNOSIS — M546 Pain in thoracic spine: Secondary | ICD-10-CM | POA: Diagnosis not present

## 2014-08-20 DIAGNOSIS — Z79899 Other long term (current) drug therapy: Secondary | ICD-10-CM | POA: Diagnosis not present

## 2014-08-20 DIAGNOSIS — R079 Chest pain, unspecified: Secondary | ICD-10-CM | POA: Diagnosis present

## 2014-08-20 NOTE — ED Provider Notes (Signed)
CSN: [id number]     Arrival date & time 08/20/14  2052 History  This chart was scribed for non-physician practitioner, Antony Madura, PA-C, working with Tilden Fossa, MD, by Modena Jansky, ED Scribe. This patient was seen in room WTR6/WTR6 and the patient's care was started at 10:21 PM.   Chief Complaint  Patient presents with  . Rib cage pain    Patient is a 34 y.o. female presenting with chest pain. The history is provided by the patient. No language interpreter was used.  Chest Pain Pain location:  R chest Pain radiates to:  Does not radiate Pain severity:  Moderate Duration:  1 day Timing:  Constant Progression:  Unchanged Chronicity:  New Context: movement   Relieved by:  None tried Worsened by:  Nothing tried Ineffective treatments:  None tried Associated symptoms: no fever and no near-syncope    HPI Comments: Meredith Lucas is a 34 y.o. female who presents to the Emergency Department complaining of right sided chest pain that started today. She states that she was shopping today when she started having pain on the right side of her ribs. She reports no treatment PTA. She reports that she is right handed. She states that her father has a hx of blood clot and heart disease. She denies any hx of hospitalization. She denies any fever, hemoptysis, leg swelling, lightheadedness, or neck pain.    History reviewed. No pertinent past medical history. Past Surgical History  Procedure Laterality Date  . Cesarean section    . Tubal ligation     History reviewed. No pertinent family history. History  Substance Use Topics  . Smoking status: Never Smoker   . Smokeless tobacco: Not on file  . Alcohol Use: No   OB History    No data available      Review of Systems  Constitutional: Negative for fever.  Cardiovascular: Positive for chest pain. Negative for leg swelling and near-syncope.  Musculoskeletal: Negative for neck pain.  Neurological: Negative for light-headedness.   All other systems reviewed and are negative.   Allergies  Review of patient's allergies indicates no known allergies.  Home Medications   Prior to Admission medications   Medication Sig Start Date End Date Taking? Authorizing Provider  ibuprofen (ADVIL,MOTRIN) 200 MG tablet Take 400 mg by mouth every 6 (six) hours as needed for headache (headache).    Yes Historical Provider, MD  famotidine (PEPCID) 20 MG tablet Take 1 tablet (20 mg total) by mouth 2 (two) times daily. Patient not taking: Reported on 08/20/2014 11/26/12   Joni Reining Pisciotta, PA-C  loperamide (IMODIUM) 2 MG capsule Take 1 capsule (2 mg total) by mouth 4 (four) times daily as needed for diarrhea or loose stools. Patient not taking: Reported on 08/20/2014 11/26/12   Joni Reining Pisciotta, PA-C  traMADol (ULTRAM) 50 MG tablet Take 1 tablet (50 mg total) by mouth every 6 (six) hours as needed for pain. Patient not taking: Reported on 08/20/2014 11/26/12   Joni Reining Pisciotta, PA-C   BP 116/73 mmHg  Pulse 99  Temp(Src) 98 F (36.7 C) (Oral)  Resp 15  Ht  (1.575 m)  Wt 200 lb (90.719 kg)  BMI 36.57 kg/m2  SpO2 98%  LMP 08/13/2014  Physical Exam  Constitutional: She is oriented to person, place, and time. She appears well-developed and well-nourished. No distress.  Nontoxic/nonseptic appearing  HENT:  Head: Normocephalic and atraumatic.  Eyes: Conjunctivae and EOM are normal. No scleral icterus.  Neck: Normal range of motion.  Cardiovascular: Normal rate, regular rhythm and intact distal pulses.   HR 91-99bpm  Pulmonary/Chest: Effort normal and breath sounds normal. No respiratory distress. She has no wheezes. She has no rales.  Lungs CTAB  Musculoskeletal: Normal range of motion. She exhibits tenderness.       Thoracic back: She exhibits tenderness. She exhibits no bony tenderness, no deformity and no spasm.       Back:  Neurological: She is alert and oriented to person, place, and time. She exhibits normal muscle tone.  Coordination normal.  Sensation to light touch intact. Equal grip strength b/l  Skin: Skin is warm and dry. No rash noted. She is not diaphoretic. No erythema. No pallor.  Psychiatric: She has a normal mood and affect. Her behavior is normal.  Nursing note and vitals reviewed.   ED Course  Procedures (including critical care time) DIAGNOSTIC STUDIES: Oxygen Saturation is 98% on RA, normal by my interpretation.    COORDINATION OF CARE: 10:25 PM- Pt advised of plan for treatment which includes radiology and pt agrees.  Labs Review Labs Reviewed  D-DIMER, QUANTITATIVE    Imaging Review Dg Chest 2 View  08/20/2014   CLINICAL DATA:  Posterior rib pain, sudden onset this evening just inferior to the right scapula. No trauma.  EXAM: CHEST  2 VIEW  COMPARISON:  08/31/2007  FINDINGS: The heart size and mediastinal contours are within normal limits. Both lungs are clear. The visualized skeletal structures are unremarkable.  IMPRESSION: No active cardiopulmonary disease.   Electronically Signed   By: Ellery Plunkaniel R Mitchell M.D.   On: 08/20/2014 21:46     EKG Interpretation None      MDM   Final diagnoses:  Right-sided thoracic back pain    34 year old female presents to the emergency department for further evaluation to pain to her right thoracic back/posterior rib cage. This is worse with palpation to the area as well as breathing and coughing. Patient low risk for DVT/PE with a negative d-dimer. No hypoxia. Chest x-ray negative for pneumothorax, pneumonia, or other acute cardiopulmonary process. Given reproducibility of pain, suspect MSK origin. Will manage as outpatient with naproxen and referred to PCP for follow-up. Return precautions given. Patient discharged in good condition; VSS.  I personally performed the services described in this documentation, which was scribed in my presence. The recorded information has been reviewed and is accurate.   Filed Vitals:   08/20/14 2059 08/20/14  2242 08/21/14 0120  BP: 116/73 144/83 111/75  Pulse: 99 95 85  Temp: 98 F (36.7 C)    TempSrc: Oral    Resp: 15 16 16   Height: 5\' 2"  (1.575 m)    Weight: 200 lb (90.719 kg)    SpO2: 98% 100% 99%       Antony MaduraKelly Jayant Kriz, PA-C 08/21/14 0320  Tilden FossaElizabeth Rees, MD 08/21/14 1450

## 2014-08-20 NOTE — ED Notes (Signed)
Pt states she was shopping today and began having rib cage pain on right side. Pt states left lung feels normal but the right one does not.

## 2014-08-21 LAB — D-DIMER, QUANTITATIVE (NOT AT ARMC): D DIMER QUANT: 0.34 ug{FEU}/mL (ref 0.00–0.48)

## 2014-08-21 MED ORDER — NAPROXEN 500 MG PO TABS: 500.0000 mg | ORAL_TABLET | Freq: Two times a day (BID) | ORAL | Status: DC

## 2014-08-21 NOTE — Discharge Instructions (Signed)

## 2016-11-24 ENCOUNTER — Ambulatory Visit (HOSPITAL_COMMUNITY)
Admission: EM | Admit: 2016-11-24 | Discharge: 2016-11-24 | Disposition: A | Discharge status: Home / Self Care | Payer: Medicaid Other | Attending: Family Medicine | Admitting: Family Medicine

## 2016-11-24 ENCOUNTER — Encounter (HOSPITAL_COMMUNITY): Payer: Self-pay | Admitting: Emergency Medicine

## 2016-11-24 DIAGNOSIS — L03119 Cellulitis of unspecified part of limb: Secondary | ICD-10-CM

## 2016-11-24 MED ORDER — DOXYCYCLINE HYCLATE 100 MG PO TABS: 100.0000 mg | ORAL_TABLET | Freq: Two times a day (BID) | ORAL | 0 refills | Status: DC

## 2016-11-24 NOTE — ED Triage Notes (Signed)
[facility] a complaint of a possible insect bite to the back of her right leg x 3 days.

## 2016-11-24 NOTE — Discharge Instructions (Signed)
Fairly worse area daily with soap and water. The antibiotic by mouth should take care of the deeper infection.

## 2016-11-24 NOTE — ED Provider Notes (Signed)
[facility]    CSN: [id number] Arrival date & time: 11/24/16  1757     History   Chief Complaint Chief Complaint  Patient presents with  . Insect Bite    HPI Meredith Lucas is a 36 y.o. female.   The patient presented to the [facility] with a complaint of a possible insect bite to the back of her right leg x 3 days. It was itchy at first but now has become painful.      History reviewed. No pertinent past medical history.  Patient Active Problem List   Diagnosis Date Noted  . OBESITY, NOS 07/29/2006  . DYSPEPSIA 07/29/2006    Past Surgical History:  Procedure Laterality Date  . CESAREAN SECTION    . TUBAL LIGATION      OB History    No data available       Home Medications    Prior to Admission medications   Medication Sig Start Date End Date Taking? Authorizing Provider  doxycycline (VIBRA-TABS) 100 MG tablet Take 1 tablet (100 mg total) by mouth 2 (two) times daily. 11/24/16   Elvina Sidle, MD    Family History History reviewed. No pertinent family history.  Social History Social History  Substance Use Topics  . Smoking status: Never Smoker  . Smokeless tobacco: Not on file  . Alcohol use No     Allergies   Patient has no known allergies.   Review of Systems Review of Systems  Skin: Positive for rash.  All other systems reviewed and are negative.    Physical Exam Triage Vital Signs ED Triage Vitals  Enc Vitals Group     BP 11/24/16 1802 132/84     Pulse Rate 11/24/16 1802 99     Resp 11/24/16 1802 18     Temp 11/24/16 1802 98.5 F (36.9 C)     Temp Source 11/24/16 1802 Oral     SpO2 11/24/16 1802 100 %     Weight --      Height --      Head Circumference --      Peak Flow --      Pain Score 11/24/16 1801 5     Pain Loc --      Pain Edu? --      Excl. in GC? --    No data found.   Updated Vital Signs BP 132/84 (BP Location: Right Arm)   Pulse 99   Temp 98.5 F (36.9 C) (Oral)   Resp 18   SpO2 100%     Physical Exam  Constitutional: She is oriented to person, place, and time. She appears well-developed.  HENT:  Right Ear: External ear normal.  Left Ear: External ear normal.  Eyes: Conjunctivae are normal. Pupils are equal, round, and reactive to light.  Neck: Normal range of motion. Neck supple.  Pulmonary/Chest: Effort normal.  Musculoskeletal: Normal range of motion.  Neurological: She is alert and oriented to person, place, and time.  Skin:  2 cm area of erythema and induration on the posterior aspect of the right mid thigh with small central pustule  Nursing note and vitals reviewed.    UC Treatments / Results  Labs (all labs ordered are listed, but only abnormal results are displayed) Labs Reviewed - No data to display  EKG  EKG Interpretation None       Radiology No results found.  Procedures Procedures (including critical care time)  Medications Ordered in UC Medications - No data  to display   Initial Impression / Assessment and Plan / UC Course  I have reviewed the triage vital signs and the nursing notes.  Pertinent labs & imaging results that were available during my care of the patient were reviewed by me and considered in my medical decision making (see chart for details).     Final Clinical Impressions(s) / UC Diagnoses   Final diagnoses:  Cellulitis of lower extremity, unspecified laterality    New Prescriptions New Prescriptions   DOXYCYCLINE (VIBRA-TABS) 100 MG TABLET    Take 1 tablet (100 mg total) by mouth 2 (two) times daily.     Elvina SidleLauenstein, Shequita Peplinski, MD 11/24/16 [phone]

## 2017-05-05 ENCOUNTER — Inpatient Hospital Stay (HOSPITAL_COMMUNITY)
Admission: AD | Admit: 2017-05-05 | Discharge: 2017-05-05 | Disposition: A | Discharge status: Home / Self Care | Payer: Medicaid Other | Source: Ambulatory Visit | Attending: Obstetrics & Gynecology | Admitting: Obstetrics & Gynecology

## 2017-05-05 ENCOUNTER — Other Ambulatory Visit: Payer: Self-pay

## 2017-05-05 ENCOUNTER — Encounter (HOSPITAL_COMMUNITY): Payer: Self-pay | Admitting: *Deleted

## 2017-05-05 DIAGNOSIS — Z3202 Encounter for pregnancy test, result negative: Secondary | ICD-10-CM | POA: Insufficient documentation

## 2017-05-05 DIAGNOSIS — N938 Other specified abnormal uterine and vaginal bleeding: Secondary | ICD-10-CM | POA: Diagnosis not present

## 2017-05-05 DIAGNOSIS — N926 Irregular menstruation, unspecified: Secondary | ICD-10-CM | POA: Diagnosis present

## 2017-05-05 HISTORY — DX: Gastro-esophageal reflux disease without esophagitis: K21.9

## 2017-05-05 LAB — URINALYSIS, ROUTINE W REFLEX MICROSCOPIC

## 2017-05-05 LAB — CBC
HCT: 35.8 % — ABNORMAL LOW (ref 36.0–46.0)
Hemoglobin: 11.3 g/dL — ABNORMAL LOW (ref 12.0–15.0)
MCH: 26.3 pg (ref 26.0–34.0)
MCHC: 31.6 g/dL (ref 30.0–36.0)
MCV: 83.3 fL (ref 78.0–100.0)
Platelets: 317 10*3/uL (ref 150–400)
RBC: 4.3 MIL/uL (ref 3.87–5.11)
RDW: 13.9 % (ref 11.5–15.5)
WBC: 10.3 10*3/uL (ref 4.0–10.5)

## 2017-05-05 LAB — URINALYSIS, MICROSCOPIC (REFLEX)

## 2017-05-05 LAB — POCT PREGNANCY, URINE
PREG TEST UR: NEGATIVE
Preg Test, Ur: NEGATIVE

## 2017-05-05 MED ORDER — MEDROXYPROGESTERONE ACETATE 10 MG PO TABS: 10.0000 mg | ORAL_TABLET | Freq: Every day | ORAL | 0 refills | Status: DC

## 2017-05-05 NOTE — Discharge Instructions (Signed)
Dysfunctional Uterine Bleeding °Dysfunctional uterine bleeding is abnormal bleeding from the uterus. Dysfunctional uterine bleeding includes: °· A period that comes earlier or later than usual. °· A period that is lighter, heavier, or has blood clots. °· Bleeding between periods. °· Skipping one or more periods. °· Bleeding after sexual intercourse. °· Bleeding after menopause. ° °Follow these instructions at home: °Pay attention to any changes in your symptoms. Follow these instructions to help with your condition: °Eating and drinking °· Eat well-balanced meals. Include foods that are high in iron, such as liver, meat, shellfish, green leafy vegetables, and eggs. °· If you become constipated: °? Drink plenty of water. °? Eat fruits and vegetables that are high in water and fiber, such as spinach, carrots, raspberries, apples, and mango. °Medicines °· Take over-the-counter and prescription medicines only as told by your health care provider. °· Do not change medicines without talking with your health care provider. °· Aspirin or medicines that contain aspirin may make the bleeding worse. Do not take those medicines: °? During the week before your period. °? During your period. °· If you were prescribed iron pills, take them as told by your health care provider. Iron pills help to replace iron that your body loses because of this condition. °Activity °· If you need to change your sanitary pad or tampon more than one time every 2 hours: °? Lie in bed with your feet raised (elevated). °? Place a cold pack on your lower abdomen. °? Rest as much as possible until the bleeding stops or slows down. °· Do not try to lose weight until the bleeding has stopped and your blood iron level is back to normal. °Other Instructions °· For two months, write down: °? When your period starts. °? When your period ends. °? When any abnormal bleeding occurs. °? What problems you notice. °· Keep all follow up visits as told by your health  care provider. This is important. °Contact a health care provider if: °· You get light-headed or weak. °· You have nausea and vomiting. °· You cannot eat or drink without vomiting. °· You feel dizzy or have diarrhea while you are taking medicines. °· You are taking birth control pills or hormones, and you want to change them or stop taking them. °Get help right away if: °· You develop a fever or chills. °· You need to change your sanitary pad or tampon more than one time per hour. °· Your bleeding becomes heavier, or your flow contains clots more often. °· You develop pain in your abdomen. °· You lose consciousness. °· You develop a rash. °This information is not intended to replace advice given to you by your health care provider. Make sure you discuss any questions you have with your health care provider. °Document Released: 05/15/2000 Document Revised: 10/24/2015 Document Reviewed: 08/13/2014 °Elsevier Interactive Patient Education © 2018 [employer] ° °

## 2017-05-05 NOTE — MAU Provider Note (Signed)
Chief Complaint: Vaginal Bleeding and Abdominal Pain   SUBJECTIVE HPI: Meredith Lucas is a 36 y.o. [id number] who presents to MAU with complaints of irregular menstrual bleeding.  Her period started Sunday with large amount of bleeding.  States that it started to get lighter on Tuesday.  She woke up this morning without any blood.  She thought her bleeding was over.  However, patient states that around 6 PM tonight after she sneezed she had a large amount of bleeding with clots.  Since then she has had light tricking of blood.  During the heavier time of her bleeding patient states that she was saturating a large pad every hour.  This is not her usual menstrual bleeding.  Patient states that the irregularity in her bleeding includes increased amount of bleeding and intramenstrual bleeding.  States that this has been new for the last 2 months.  She is having worsening in the cramping of her abdomen.  Patient notes that she usually has heavy bad periods.  She tries motion with minimal improvement.  Patient tried calling her PCP office but was unable to get an appointment so presented to [facility] U. Denies fevers, nausea, vomiting, headache. Endorsing fatigue.    No past medical history on file. OB History  Gravida Para Term Preterm AB Living  4 4 4     4   SAB TAB Ectopic Multiple Live Births          4    # Outcome Date GA Lbr Len/2nd Weight Sex Delivery Anes PTL Lv  4 Term     F CS-Unspec     3 Term           2 Term           1 Term              Past Surgical History:  Procedure Laterality Date  . CESAREAN SECTION    . TUBAL LIGATION     Social History   Socioeconomic History  . Marital status: Married    Spouse name: Not on file  . Number of children: Not on file  . Years of education: Not on file  . Highest education level: Not on file  Social Needs  . Financial resource strain: Not on file  . Food insecurity - worry: Not on file  . Food insecurity - inability: Not on file  .  Transportation needs - medical: Not on file  . Transportation needs - non-medical: Not on file  Occupational History  . Not on file  Tobacco Use  . Smoking status: Never Smoker  Substance and Sexual Activity  . Alcohol use: No  . Drug use: No  . Sexual activity: Yes  Other Topics Concern  . Not on file  Social History Narrative  . Not on file   No current facility-administered medications on file prior to encounter.    Current Outpatient Medications on File Prior to Encounter  Medication Sig Dispense Refill  . ibuprofen (ADVIL,MOTRIN) 200 MG tablet Take 400 mg by mouth every 6 (six) hours as needed.    . doxycycline (VIBRA-TABS) 100 MG tablet Take 1 tablet (100 mg total) by mouth 2 (two) times daily. 20 tablet 0   No Known Allergies  I have reviewed the past Medical Hx, Surgical Hx, Social Hx, Allergies and Medications.   REVIEW OF SYSTEMS All systems reviewed and are negative for acute change except as noted in the HPI.   OBJECTIVE BP 124/80 (BP Location:  Right Arm)   Pulse 95   Temp 98 F (36.7 C) (Oral)   Resp 20   Ht 5\' 3"  (1.6 m)   Wt 104.8 kg (231 lb)   LMP 05/02/2017   SpO2 99%   BMI 40.92 kg/m    PHYSICAL EXAM Constitutional: Well-developed, well-nourished female in no acute distress.  Cardiovascular: normal rate and rhythm, pulses intact Respiratory: normal rate and effort.  GI: Abd soft, non-tender, non-distended. No guarding or rebound tenderness.  MS: Extremities nontender, no edema, normal ROM Neurologic: Alert and oriented x 4. No focal deficits SPECULUM EXAM: NEFG, menstaul blood noted, cervix clean with minimal blood from os, no blood clots Psych: normal mood and affect  LAB RESULTS Results for orders placed or performed during the hospital encounter of 05/05/17 (from the past 24 hour(s))  Urinalysis, Routine w reflex microscopic     Status: Abnormal   Collection Time: 05/05/17  8:14 PM  Result Value Ref Range   Color, Urine RED (A) YELLOW    APPearance TURBID (A) CLEAR   Specific Gravity, Urine  1.005 - 1.030    TEST NOT REPORTED DUE TO COLOR INTERFERENCE OF URINE PIGMENT   pH  5.0 - 8.0    TEST NOT REPORTED DUE TO COLOR INTERFERENCE OF URINE PIGMENT   Glucose, UA (A) NEGATIVE mg/dL    TEST NOT REPORTED DUE TO COLOR INTERFERENCE OF URINE PIGMENT   Hgb urine dipstick (A) NEGATIVE    TEST NOT REPORTED DUE TO COLOR INTERFERENCE OF URINE PIGMENT   Bilirubin Urine (A) NEGATIVE    TEST NOT REPORTED DUE TO COLOR INTERFERENCE OF URINE PIGMENT   Ketones, ur (A) NEGATIVE mg/dL    TEST NOT REPORTED DUE TO COLOR INTERFERENCE OF URINE PIGMENT   Protein, ur (A) NEGATIVE mg/dL    TEST NOT REPORTED DUE TO COLOR INTERFERENCE OF URINE PIGMENT   Nitrite (A) NEGATIVE    TEST NOT REPORTED DUE TO COLOR INTERFERENCE OF URINE PIGMENT   Leukocytes, UA (A) NEGATIVE    TEST NOT REPORTED DUE TO COLOR INTERFERENCE OF URINE PIGMENT  Urinalysis, Microscopic (reflex)     Status: Abnormal   Collection Time: 05/05/17  8:14 PM  Result Value Ref Range   RBC / HPF TOO NUMEROUS TO COUNT 0 - 5 RBC/hpf   WBC, UA 0-5 0 - 5 WBC/hpf   Bacteria, UA MANY (A) NONE SEEN   Squamous Epithelial / LPF 0-5 (A) NONE SEEN   Urine-Other MICROSCOPIC EXAM PERFORMED ON UNCONCENTRATED URINE   Pregnancy, urine POC     Status: None   Collection Time: 05/05/17  8:25 PM  Result Value Ref Range   Preg Test, Ur NEGATIVE NEGATIVE  Pregnancy, urine POC     Status: None   Collection Time: 05/05/17  9:11 PM  Result Value Ref Range   Preg Test, Ur NEGATIVE NEGATIVE  CBC     Status: Abnormal   Collection Time: 05/05/17 11:02 PM  Result Value Ref Range   WBC 10.3 4.0 - 10.5 K/uL   RBC 4.30 3.87 - 5.11 MIL/uL   Hemoglobin 11.3 (L) 12.0 - 15.0 g/dL   HCT 75.635.8 (L) 43.336.0 - 29.546.0 %   MCV 83.3 78.0 - 100.0 fL   MCH 26.3 26.0 - 34.0 pg   MCHC 31.6 30.0 - 36.0 g/dL   RDW 18.813.9 41.611.5 - 60.615.5 %   Platelets 317 150 - 400 K/uL    IMAGING No results found.  MAU COURSE Vitals and nursing  notes reviewed  UA unremarkable Hbg stable without significant anemia Preg test negative No need for imaging  MDM Plan of care reviewed with patient, including labs and tests ordered and medical treatment.   ASSESSMENT 1. Dysfunctional uterine bleeding     PLAN Discharge home in stable condition. Patient encouraged to follow-up with outpatient gynecology for AUB work-up Rx for Provera if heavy bleeding persist past a week Counseled on return precautions Handout given   Caryl Ada, DO OB [facility], [facility] [place] 05/05/2017, 10:35 PM

## 2017-05-05 NOTE — MAU Note (Signed)
My period started Sunday, started gushing blood, went everywhere in chair at work.  It lightned Monday, it stayed within the pad. On Tuesday, it was still the same. This morning, no blood at all and now, I feel a pulling sensation.  Feel sharp pains in low abd. Sneezed this afternoon and vaginal blood everywhere. Goes to [facility].  Feels fatigue. Had Tubal ligation in past.

## 2017-08-06 ENCOUNTER — Encounter (HOSPITAL_COMMUNITY): Payer: Self-pay | Admitting: Emergency Medicine

## 2017-08-06 ENCOUNTER — Other Ambulatory Visit: Payer: Self-pay

## 2017-08-06 ENCOUNTER — Emergency Department (HOSPITAL_COMMUNITY)
Admission: EM | Admit: 2017-08-06 | Discharge: 2017-08-06 | Disposition: A | Discharge status: Home / Self Care | Payer: Medicaid Other | Attending: Emergency Medicine | Admitting: Emergency Medicine

## 2017-08-06 ENCOUNTER — Ambulatory Visit (HOSPITAL_COMMUNITY)
Admission: EM | Admit: 2017-08-06 | Discharge: 2017-08-06 | Disposition: A | Discharge status: Home / Self Care | Payer: Medicaid Other | Attending: Internal Medicine | Admitting: Internal Medicine

## 2017-08-06 DIAGNOSIS — R109 Unspecified abdominal pain: Secondary | ICD-10-CM

## 2017-08-06 DIAGNOSIS — M546 Pain in thoracic spine: Secondary | ICD-10-CM | POA: Diagnosis not present

## 2017-08-06 DIAGNOSIS — Z3202 Encounter for pregnancy test, result negative: Secondary | ICD-10-CM

## 2017-08-06 DIAGNOSIS — R1032 Left lower quadrant pain: Secondary | ICD-10-CM | POA: Insufficient documentation

## 2017-08-06 DIAGNOSIS — R1031 Right lower quadrant pain: Secondary | ICD-10-CM | POA: Diagnosis not present

## 2017-08-06 LAB — POCT URINALYSIS DIP (DEVICE)
BILIRUBIN URINE: NEGATIVE
Glucose, UA: NEGATIVE mg/dL
HGB URINE DIPSTICK: NEGATIVE
Ketones, ur: NEGATIVE mg/dL
Leukocytes, UA: NEGATIVE
NITRITE: NEGATIVE
PH: 8.5 — AB (ref 5.0–8.0)
Protein, ur: NEGATIVE mg/dL
Specific Gravity, Urine: 1.015 (ref 1.005–1.030)
Urobilinogen, UA: 0.2 mg/dL (ref 0.0–1.0)

## 2017-08-06 LAB — COMPREHENSIVE METABOLIC PANEL
ALK PHOS: 79 U/L (ref 38–126)
ALT: 15 U/L (ref 14–54)
AST: 15 U/L (ref 15–41)
Albumin: 3.6 g/dL (ref 3.5–5.0)
Anion gap: 8 (ref 5–15)
BUN: 9 mg/dL (ref 6–20)
CO2: 25 mmol/L (ref 22–32)
Calcium: 9.3 mg/dL (ref 8.9–10.3)
Chloride: 104 mmol/L (ref 101–111)
Creatinine, Ser: 0.67 mg/dL (ref 0.44–1.00)
GFR calc Af Amer: >60 mL/min (ref >60)
GFR calc non Af Amer: >60 mL/min (ref >60)
GLUCOSE: 97 mg/dL (ref 65–99)
POTASSIUM: 3.8 mmol/L (ref 3.5–5.1)
SODIUM: 137 mmol/L (ref 135–145)
TOTAL PROTEIN: 7.8 g/dL (ref 6.5–8.1)
Total Bilirubin: 0.5 mg/dL (ref 0.3–1.2)

## 2017-08-06 LAB — CBC
HCT: 36.6 % (ref 36.0–46.0)
Hemoglobin: 11.8 g/dL — ABNORMAL LOW (ref 12.0–15.0)
MCH: 25.9 pg — ABNORMAL LOW (ref 26.0–34.0)
MCHC: 32.2 g/dL (ref 30.0–36.0)
MCV: 80.4 fL (ref 78.0–100.0)
PLATELETS: 362 10*3/uL (ref 150–400)
RBC: 4.55 MIL/uL (ref 3.87–5.11)
RDW: 14.6 % (ref 11.5–15.5)
WBC: 9.6 10*3/uL (ref 4.0–10.5)

## 2017-08-06 LAB — POCT PREGNANCY, URINE: PREG TEST UR: NEGATIVE

## 2017-08-06 LAB — CBG MONITORING, ED: Glucose-Capillary: 92 mg/dL (ref 65–99)

## 2017-08-06 LAB — LIPASE, BLOOD: Lipase: 26 U/L (ref 11–51)

## 2017-08-06 MED ORDER — KETOROLAC TROMETHAMINE 15 MG/ML IJ SOLN: 15.0000 mg | Freq: Once | INTRAMUSCULAR | Status: DC

## 2017-08-06 MED ORDER — MELOXICAM 7.5 MG PO TABS: 7.5000 mg | ORAL_TABLET | Freq: Every day | ORAL | 0 refills | Status: DC

## 2017-08-06 MED ORDER — CYCLOBENZAPRINE HCL 10 MG PO TABS: 5.0000 mg | ORAL_TABLET | Freq: Every evening | ORAL | 0 refills | Status: DC | PRN

## 2017-08-06 MED ORDER — KETOROLAC TROMETHAMINE 30 MG/ML IJ SOLN
15.0000 mg | Freq: Once | INTRAMUSCULAR | Status: AC
  Administered 2017-08-06: 15 mg via INTRAMUSCULAR
  Filled 2017-08-06: qty 1

## 2017-08-06 NOTE — ED Notes (Signed)
92 CBG

## 2017-08-06 NOTE — Discharge Instructions (Signed)
Results of your lab work today were normal.  Your previous urinalysis also showed no evidence of infection or blood in your urine.  You may continue to take Tylenol and ibuprofen at home for your symptoms as well as the prescription that you were given for the muscle relaxer after your visit at urgent care.  You should follow-up with your doctor within a week to check resolution of your symptoms.  If you have any changes in your symptoms then you will need to return to the emergency department.  Other reasons to return to the emergency department include  Back pain that is persistent Abdominal pain that does not go away.  You have a fever.  You keep throwing up (vomiting).  The pain is felt only in portions of the abdomen. Pain in the right side could possibly be appendicitis. In an adult, pain in the left lower portion of the abdomen could be colitis or diverticulitis.  You pass bloody or black tarry stools.  There is bright red blood in the stool.  The constipation stays for more than 4 days.  There is belly (abdominal) or rectal pain.  You do not seem to be getting better.  You have any questions or concerns.

## 2017-08-06 NOTE — ED Triage Notes (Signed)
Patient report bilateral flank pain, worse on right side since Monday. Pt states she thinks she is dehydrated, so she has been increasing her water intake. [facility] with a normal urine screen. She reports taking Tylenol at 0700.

## 2017-08-06 NOTE — Discharge Instructions (Signed)
Your urine was negative for urinary tract infection, pregnancy, glucose. I will still send urine for culture given your symptoms. Your exam was most consistent with muscle strain. Start Mobic as directed. Do not take ibuprofen (advil, motrin)/naproxen (aleve). Flexeril as needed at night for spasms/tightness. Flexeril can make you drowsy, so do not take if you are going to drive, operate heavy machinery, or make important decisions. Ice/heat compresses as needed. This can take up to 3-4 weeks to completely resolve, but you should be feeling better each week. Follow up here or with PCP if symptoms worsen, changes for reevaluation. If experience numbness/tingling of the inner thighs, loss of bladder or bowel control, go to the emergency department for evaluation.

## 2017-08-06 NOTE — ED Triage Notes (Signed)
Pt c/o feeling dehydrated Monday, drinking lots of fluids, the next day started having lower back pain(bilateral flank pain). Pain has gotten worse and worse. Pt also endorses frequency with urination.

## 2017-08-06 NOTE — ED Provider Notes (Addendum)
[facility]-EMERGENCY DEPT Provider Note   CSN: [id number] Arrival date & time: 08/06/17  1235     History   Chief Complaint Chief Complaint  Patient presents with  . Flank Pain    HPI Meredith Lucas is a 37 y.o. female.  HPI   Pt Is a 37 year old female complaining of bilateral lower back/flank pain that began about 5 days ago and has been worsening since onset.  Patient states pain is 8/10.  Reports associated fevers and nausea, but no vomiting.  No abdominal pain, diarrhea, constipation, blood in stool.  Denies urinary frequency, hesitancy, urgency, vaginal bleeding, vaginal discharge, vaginal irritation.  No pelvic pain. No CP/SOB.  No concern for STDs.  States she has been taking 800 mg ibuprofen every 6 hours with mild relief.  Does report polydipsia and polyuria.  Patient denies history of diabetes.  She was seen in urgent care prior 2 hours prior to arrival with same compaint and was diagnosed with muscle strain and sent home with muscle relaxants.  Patient states she was unaware she received a prescription for muscle relaxants.  During visit she had negative UA, notably negative for infection or hematuria.  Negative pregnancy test at that time.  Urine was sent for culture.   Past Medical History:  Diagnosis Date  . GERD (gastroesophageal reflux disease)     Patient Active Problem List   Diagnosis Date Noted  . OBESITY, NOS 07/29/2006  . DYSPEPSIA 07/29/2006    Past Surgical History:  Procedure Laterality Date  . CESAREAN SECTION    . TUBAL LIGATION      OB History    Gravida Para Term Preterm AB Living   4 4 4     4    SAB TAB Ectopic Multiple Live Births           4       Home Medications    Prior to Admission medications   Medication Sig Start Date End Date Taking? Authorizing Provider  cyclobenzaprine (FLEXERIL) 10 MG tablet Take 0.5-1 tablets (5-10 mg total) by mouth at bedtime as needed for muscle spasms. 08/06/17   Cathie Hoops, Amy  V, PA-C  ibuprofen (ADVIL,MOTRIN) 200 MG tablet Take 400 mg by mouth every 6 (six) hours as needed.    [provider]  medroxyPROGESTERone (PROVERA) 10 MG tablet Take 1 tablet (10 mg total) by mouth daily for 7 days. Patient not taking: Reported on 08/06/2017 05/05/17 05/12/17  Pincus Large, DO  meloxicam (MOBIC) 7.5 MG tablet Take 1 tablet (7.5 mg total) by mouth daily. 08/06/17   Belinda Fisher, PA-C    Family History Family History  Problem Relation Age of Onset  . Cancer Mother   . Heart disease Father   . Diabetes Father   . Hypertension Father   . Cancer Sister   . Cancer Maternal Aunt   . Hypertension Paternal Grandmother   . Heart disease Paternal Grandmother     Social History Social History   Tobacco Use  . Smoking status: Never Smoker  Substance Use Topics  . Alcohol use: No  . Drug use: No     Allergies   Patient has no known allergies.   Review of Systems Review of Systems  Constitutional: Positive for fever. Negative for appetite change and chills.  HENT: Negative for ear pain and sore throat.   Eyes: Negative for pain and visual disturbance.  Respiratory: Negative for cough and shortness of breath.   Cardiovascular: Negative  for chest pain and palpitations.  Gastrointestinal: Positive for nausea. Negative for abdominal pain, blood in stool, constipation, diarrhea and vomiting.  Endocrine: Positive for polydipsia and polyuria.  Genitourinary: Positive for frequency. Negative for decreased urine volume, dysuria, hematuria, pelvic pain, urgency, vaginal bleeding, vaginal discharge and vaginal pain.  Musculoskeletal: Negative for arthralgias and back pain.  Skin: Negative for color change and rash.  Neurological: Negative for seizures, syncope and headaches.  All other systems reviewed and are negative.    Physical Exam Updated Vital Signs BP 124/84 (BP Location: Left Arm)   Pulse 95   Temp 98.4 F (36.9 C) (Oral)   Resp 17   LMP 07/23/2017    SpO2 100%   Physical Exam  Constitutional: She appears well-developed and well-nourished. No distress.  Nontoxic, no acute distress.  HENT:  Head: Normocephalic and atraumatic.  Eyes: Conjunctivae are normal.  Neck: Neck supple.  Cardiovascular: Normal rate, regular rhythm, normal heart sounds and intact distal pulses.  No murmur heard. Pulmonary/Chest: Effort normal and breath sounds normal. No respiratory distress. She has no wheezes.  Abdominal: Soft. Bowel sounds are normal. She exhibits no distension. There is no tenderness. There is no guarding.  Mild CVA ttp to right  Musculoskeletal: She exhibits no edema.  No TTP to the cervical, thoracic, or lumbar spine. No ttp to sciatic notch b/l. ttp to upper lumbar paraspinous muscles bilat, worse on right.   Neurological: She is alert.  Motor:  Normal tone. 5/5 strength of BUE and BLE major muscle groups including strong and equal grip strength and dorsiflexion/plantar flexion Sensory: light touch normal in all extremities. DTRs: Patellar and achilles 2+ symmetric b/l Gait: normal gait and balance.  CV: 2+ radial and DP/PT pulses  Skin: Skin is warm and dry.  Psychiatric: She has a normal mood and affect.  Nursing note and vitals reviewed.    ED Treatments / Results  Labs (all labs ordered are listed, but only abnormal results are displayed) Labs Reviewed  CBC - Abnormal; Notable for the following components:      Result Value   Hemoglobin 11.8 (*)    MCH 25.9 (*)    All other components within normal limits  COMPREHENSIVE METABOLIC PANEL  LIPASE, BLOOD  CBG MONITORING, ED    EKG  EKG Interpretation None       Radiology No results found.  Procedures Procedures (including critical care time)  Medications Ordered in ED Medications  ketorolac (TORADOL) 30 MG/ML injection 15 mg (15 mg Intramuscular Given 08/06/17 1538)     Initial Impression / Assessment and Plan / ED Course  I have reviewed the triage vital  signs and the nursing notes.  Pertinent labs & imaging results that were available during my care of the patient were reviewed by me and considered in my medical decision making (see chart for details).       Final Clinical Impressions(s) / ED Diagnoses   Final diagnoses:  Flank pain   37 year old female presenting with bilateral flank pain for the last 5 days, right greater than left.  Also with urinary frequency and increased thirst.  Reported fevers at home.    Vital signs stable here, afebrile.  Nontoxic appearing in no acute distress.  She was seen in urgent care prior to arrival and diagnosed with musculoskeletal pain, sent home with muscle relaxers.  Lab work is normal here with no leukocytosis, mild anemia that is stable.  Normal kidney function and electrolytes.  Normal lipase.  Normal  liver function.  Urinalysis from urgent care shows no evidence of infection, so doubt pyelo.  UA also showed no hematuria so doubt nephrolithiasis.  Abdominal exam benign.  Doubt other acute intra-abdominal infection or pathology that would require emergent treatment.  Patient advised to continue taking anti-inflammatories at home as well as muscle relaxer that she was given earlier today.  Advised follow-up with PCP within 1 week to check resolution of symptoms.  Advised to return to the emergency department for any persistent, new, or worsening symptoms.  She understands the reasons to return and agrees to follow-up as discussed.  All questions were answered.   ED Discharge Orders    None       Karrie Meres, PA-C 08/06/17 [address], Taji Barretto S, PA-C 08/06/17 2234    Arby Barrette, MD 08/07/17 [phone]

## 2017-08-06 NOTE — ED Provider Notes (Signed)
[facility]    CSN: [id number] Arrival date & time: 08/06/17  1040     History   Chief Complaint Chief Complaint  Patient presents with  . Flank Pain    HPI Meredith Lucas is a 37 y.o. female.   37 year old female comes in for 3-day history of lower back/bilateral flank pain.  Patient states she was feeling dehydrated 4 days ago, and drink a lot of fluids.  Started having the pain the next day, and gradually worsened.  Pain is bilateral lateral flank/thoracic region, dull, aching with intermittent tightness and cramping. She is also experiencing urinary frequency without dysuria, hematuria, urgency.  Denies abdominal pain, nausea, vomiting. States woke up with cold chills last night, took ibuprofen which resolved her chills as well as improved the back pain.  Movement does cause the pain to be worse.  Denies any injury/trauma.  Denies numbness, tingling, loss of bladder or bowel control.  Denies personal history of diabetes.      Past Medical History:  Diagnosis Date  . GERD (gastroesophageal reflux disease)     Patient Active Problem List   Diagnosis Date Noted  . OBESITY, NOS 07/29/2006  . DYSPEPSIA 07/29/2006    Past Surgical History:  Procedure Laterality Date  . CESAREAN SECTION    . TUBAL LIGATION      OB History    Gravida Para Term Preterm AB Living   4 4 4     4    SAB TAB Ectopic Multiple Live Births           4       Home Medications    Prior to Admission medications   Medication Sig Start Date End Date Taking? Authorizing Provider  cyclobenzaprine (FLEXERIL) 10 MG tablet Take 0.5-1 tablets (5-10 mg total) by mouth at bedtime as needed for muscle spasms. 08/06/17   Cathie Hoops, Saya Mccoll V, PA-C  ibuprofen (ADVIL,MOTRIN) 200 MG tablet Take 400 mg by mouth every 6 (six) hours as needed.    [provider]  medroxyPROGESTERone (PROVERA) 10 MG tablet Take 1 tablet (10 mg total) by mouth daily for 7 days. Patient not taking: Reported on 08/06/2017  05/05/17 05/12/17  Pincus Large, DO  meloxicam (MOBIC) 7.5 MG tablet Take 1 tablet (7.5 mg total) by mouth daily. 08/06/17   Belinda Fisher, PA-C    Family History Family History  Problem Relation Age of Onset  . Cancer Mother   . Heart disease Father   . Diabetes Father   . Hypertension Father   . Cancer Sister   . Cancer Maternal Aunt   . Hypertension Paternal Grandmother   . Heart disease Paternal Grandmother     Social History Social History   Tobacco Use  . Smoking status: Never Smoker  Substance Use Topics  . Alcohol use: No  . Drug use: No     Allergies   Patient has no known allergies.   Review of Systems Review of Systems  Reason unable to perform ROS: See HPI as above.     Physical Exam Triage Vital Signs ED Triage Vitals [08/06/17 1114]  Enc Vitals Group     BP 134/88     Pulse Rate 96     Resp 18     Temp 97.9 F (36.6 C)     Temp src      SpO2 100 %     Weight      Height      Head Circumference  Peak Flow      Pain Score      Pain Loc      Pain Edu?      Excl. in GC?    No data found.  Updated Vital Signs BP 134/88   Pulse 96   Temp 97.9 F (36.6 C)   Resp 18   LMP 07/23/2017   SpO2 100%   Physical Exam  Constitutional: She is oriented to person, place, and time. She appears well-developed and well-nourished. No distress.  HENT:  Head: Normocephalic and atraumatic.  Eyes: Conjunctivae are normal. Pupils are equal, round, and reactive to light.  Cardiovascular: Normal rate, regular rhythm and normal heart sounds. Exam reveals no gallop and no friction rub.  No murmur heard. Pulmonary/Chest: Effort normal and breath sounds normal. She has no wheezes. She has no rales.  Musculoskeletal:  No tenderness on palpation of the spinous processes.  Tenderness to palpation of bilateral thoracic and lumbar region.  Full range of motion back and hips. Strength normal and equal bilaterally. Sensation intact and equal bilaterally.  Negative  straight leg raise.  No CVA tenderness.  Neurological: She is alert and oriented to person, place, and time.  Skin: Skin is warm and dry.     UC Treatments / Results  Labs (all labs ordered are listed, but only abnormal results are displayed) Labs Reviewed  POCT URINALYSIS DIP (DEVICE) - Abnormal; Notable for the following components:      Result Value   pH 8.5 (*)    All other components within normal limits  URINE CULTURE  POCT PREGNANCY, URINE    EKG  EKG Interpretation None       Radiology No results found.  Procedures Procedures (including critical care time)  Medications Ordered in UC Medications - No data to display   Initial Impression / Assessment and Plan / UC Course  I have reviewed the triage vital signs and the nursing notes.  Pertinent labs & imaging results that were available during my care of the patient were reviewed by me and considered in my medical decision making (see chart for details).    Urine dipstick without glucose, leukocytes, nitrites. Pregnancy negative. Discussed with patient history and exam most consistent with muscle strain. Start NSAID as directed for pain and inflammation. Muscle relaxant as needed. Ice/heat compresses. Discussed with patient strain can take up to 3-4 weeks to resolve, but should be getting better each week. Return precautions given.  Patient expresses understanding and agrees with plan.  Final Clinical Impressions(s) / UC Diagnoses   Final diagnoses:  Acute bilateral thoracic back pain    ED Discharge Orders        Ordered    meloxicam (MOBIC) 7.5 MG tablet  Daily     08/06/17 1210    cyclobenzaprine (FLEXERIL) 10 MG tablet  At bedtime PRN     08/06/17 1210        Belinda FisherYu, Domonique Brouillard V, PA-C 08/06/17 1216

## 2017-08-07 LAB — URINE CULTURE: Culture: NO GROWTH

## 2017-11-03 DIAGNOSIS — R05 Cough: Secondary | ICD-10-CM | POA: Diagnosis not present

## 2017-11-03 DIAGNOSIS — J069 Acute upper respiratory infection, unspecified: Secondary | ICD-10-CM | POA: Diagnosis not present

## 2017-11-09 ENCOUNTER — Encounter (HOSPITAL_BASED_OUTPATIENT_CLINIC_OR_DEPARTMENT_OTHER): Payer: Self-pay | Admitting: Emergency Medicine

## 2017-11-09 ENCOUNTER — Other Ambulatory Visit: Payer: Self-pay

## 2017-11-09 ENCOUNTER — Emergency Department (HOSPITAL_BASED_OUTPATIENT_CLINIC_OR_DEPARTMENT_OTHER)
Admission: EM | Admit: 2017-11-09 | Discharge: 2017-11-09 | Disposition: A | Discharge status: Home / Self Care | Payer: Medicaid Other | Attending: Emergency Medicine | Admitting: Emergency Medicine

## 2017-11-09 ENCOUNTER — Emergency Department (HOSPITAL_BASED_OUTPATIENT_CLINIC_OR_DEPARTMENT_OTHER): Payer: Medicaid Other

## 2017-11-09 DIAGNOSIS — R05 Cough: Secondary | ICD-10-CM

## 2017-11-09 DIAGNOSIS — Z79899 Other long term (current) drug therapy: Secondary | ICD-10-CM | POA: Diagnosis not present

## 2017-11-09 DIAGNOSIS — J40 Bronchitis, not specified as acute or chronic: Secondary | ICD-10-CM | POA: Diagnosis not present

## 2017-11-09 DIAGNOSIS — R0602 Shortness of breath: Secondary | ICD-10-CM | POA: Diagnosis not present

## 2017-11-09 DIAGNOSIS — R059 Cough, unspecified: Secondary | ICD-10-CM

## 2017-11-09 LAB — BASIC METABOLIC PANEL
ANION GAP: 6 (ref 5–15)
BUN: 9 mg/dL (ref 6–20)
CALCIUM: 8.7 mg/dL — AB (ref 8.9–10.3)
CHLORIDE: 106 mmol/L (ref 101–111)
CO2: 25 mmol/L (ref 22–32)
Creatinine, Ser: 0.73 mg/dL (ref 0.44–1.00)
GFR calc non Af Amer: >60 mL/min (ref >60)
GLUCOSE: 137 mg/dL — AB (ref 65–99)
POTASSIUM: 3.4 mmol/L — AB (ref 3.5–5.1)
Sodium: 137 mmol/L (ref 135–145)

## 2017-11-09 LAB — CBC WITH DIFFERENTIAL/PLATELET
BASOS PCT: 0 %
Basophils Absolute: 0 10*3/uL (ref 0.0–0.1)
Eosinophils Absolute: 0.3 10*3/uL (ref 0.0–0.7)
Eosinophils Relative: 4 %
HEMATOCRIT: 33.9 % — AB (ref 36.0–46.0)
HEMOGLOBIN: 11 g/dL — AB (ref 12.0–15.0)
LYMPHS ABS: 1.9 10*3/uL (ref 0.7–4.0)
LYMPHS PCT: 21 %
MCH: 25.9 pg — AB (ref 26.0–34.0)
MCHC: 32.4 g/dL (ref 30.0–36.0)
MCV: 79.8 fL (ref 78.0–100.0)
MONO ABS: 0.5 10*3/uL (ref 0.1–1.0)
MONOS PCT: 6 %
NEUTROS ABS: 6.3 10*3/uL (ref 1.7–7.7)
NEUTROS PCT: 69 %
Platelets: 303 10*3/uL (ref 150–400)
RBC: 4.25 MIL/uL (ref 3.87–5.11)
RDW: 15 % (ref 11.5–15.5)
WBC: 9 10*3/uL (ref 4.0–10.5)

## 2017-11-09 LAB — D-DIMER, QUANTITATIVE: D-Dimer, Quant: 0.61 ug/mL-FEU — ABNORMAL HIGH (ref 0.00–0.50)

## 2017-11-09 LAB — TROPONIN I

## 2017-11-09 MED ORDER — IPRATROPIUM-ALBUTEROL 0.5-2.5 (3) MG/3ML IN SOLN
3.0000 mL | Freq: Once | RESPIRATORY_TRACT | Status: AC
  Administered 2017-11-09: 3 mL via RESPIRATORY_TRACT
  Filled 2017-11-09: qty 3

## 2017-11-09 MED ORDER — SODIUM CHLORIDE 0.9 % IV BOLUS
1000.0000 mL | Freq: Once | INTRAVENOUS | Status: AC
  Administered 2017-11-09: 1000 mL via INTRAVENOUS

## 2017-11-09 MED ORDER — PREDNISONE 20 MG PO TABS: 40.0000 mg | ORAL_TABLET | Freq: Every day | ORAL | 0 refills | Status: AC

## 2017-11-09 MED ORDER — PREDNISONE 50 MG PO TABS
60.0000 mg | ORAL_TABLET | Freq: Once | ORAL | Status: AC
  Administered 2017-11-09: 60 mg via ORAL
  Filled 2017-11-09: qty 1

## 2017-11-09 MED ORDER — IOPAMIDOL (ISOVUE-370) INJECTION 76%
100.0000 mL | Freq: Once | INTRAVENOUS | Status: AC | PRN
  Administered 2017-11-09: 100 mL via INTRAVENOUS

## 2017-11-09 MED ORDER — ALBUTEROL SULFATE (2.5 MG/3ML) 0.083% IN NEBU: 2.5000 mg | INHALATION_SOLUTION | Freq: Four times a day (QID) | RESPIRATORY_TRACT | 0 refills | Status: DC | PRN

## 2017-11-09 MED ORDER — BENZONATATE 100 MG PO CAPS: 100.0000 mg | ORAL_CAPSULE | Freq: Three times a day (TID) | ORAL | 0 refills | Status: DC

## 2017-11-09 NOTE — Discharge Instructions (Signed)
Your lab work, EKG, CT scan were reassuring.  Your symptoms and exam are consistent with acute bronchitis See attached handout. Take medication as prescribed.  Follow up with your PCP this week.  If you develop worsening or new concerning symptoms you can return to the emergency department for re-evaluation.

## 2017-11-09 NOTE — ED Notes (Signed)
Per physician's  order patient ambulated via pulse ox. Patient maintained oxygen saturations between 96-97% with heart rate of 116-120. Patient maintained steady gait without tachypnea or signs of SOB while patient speaking in complete sentences. Patient returned to room and  requested to go to bathroom. Patient tolerated well.

## 2017-11-09 NOTE — ED Provider Notes (Signed)
[facility] EMERGENCY DEPARTMENT Provider Note   CSN: [id number] Arrival date & time: 11/09/17  1511     History   Chief Complaint Chief Complaint  Patient presents with  . Cough    HPI Meredith Lucas is a 37 y.o. female with past medical history of GERD who presents the emergency department today for cough and shortness of breath.  Patient reports that over the last 2 weeks she has been dealing with a cough.  She reports this started out as nasal congestion, rhinorrhea and a dry nonproductive cough.  She was seen in urgent care and was told she had a viral URI.  She notes that the symptoms continue despite Mucinex treatment.  She was seen again on 6/2 as her symptoms improved and had a negative chest x-ray and was told she had bronchitis.  She was prescribed an albuterol inhaler but has not used this that she knows it makes her feel jittery.  She was not prescribed prednisone.  She has been using coolmist humidifier for symptoms without relief.  She reports that she has been taking over-the-counter medications as well without relief.  She is presenting today because when she was in relating her household she felt short of breath which was new.  She denies any associated chest pain.  She has history of COPD, asthma or CHF.  No tobacco abuse.  She denies any associated, chills, current nasal congestion, sore throat, neck stiffness, any chest pain, chest tightness, shortness of breath at rest, orthopnea, paroxysmal nocturnal dyspnea, or lower leg swelling. Denies risk factors for PE including exogenous estrogen use, recent surgery or travel, trauma, immobilization, previous blood clot, cough, hemoptysis, cancer, lower extremity pain or swelling.  HPI  Past Medical History:  Diagnosis Date  . GERD (gastroesophageal reflux disease)     Patient Active Problem List   Diagnosis Date Noted  . OBESITY, NOS 07/29/2006  . DYSPEPSIA 07/29/2006    Past Surgical History:  Procedure  Laterality Date  . CESAREAN SECTION    . TUBAL LIGATION       OB History    Gravida  4   Para  4   Term  4   Preterm      AB      Living  4     SAB      TAB      Ectopic      Multiple      Live Births  4            Home Medications    Prior to Admission medications   Medication Sig Start Date End Date Taking? Authorizing Provider  cyclobenzaprine (FLEXERIL) 10 MG tablet Take 0.5-1 tablets (5-10 mg total) by mouth at bedtime as needed for muscle spasms. 08/06/17   Cathie Hoops, Amy V, PA-C  ibuprofen (ADVIL,MOTRIN) 200 MG tablet Take 400 mg by mouth every 6 (six) hours as needed.    [provider]  medroxyPROGESTERone (PROVERA) 10 MG tablet Take 1 tablet (10 mg total) by mouth daily for 7 days. Patient not taking: Reported on 08/06/2017 05/05/17 05/12/17  Pincus Large, DO  meloxicam (MOBIC) 7.5 MG tablet Take 1 tablet (7.5 mg total) by mouth daily. 08/06/17   Belinda Fisher, PA-C    Family History Family History  Problem Relation Age of Onset  . Cancer Mother   . Heart disease Father   . Diabetes Father   . Hypertension Father   . Cancer Sister   .  Cancer Maternal Aunt   . Hypertension Paternal Grandmother   . Heart disease Paternal Grandmother     Social History Social History   Tobacco Use  . Smoking status: Never Smoker  . Smokeless tobacco: Never Used  Substance Use Topics  . Alcohol use: No  . Drug use: No     Allergies   Patient has no known allergies.   Review of Systems Review of Systems  All other systems reviewed and are negative.    Physical Exam Updated Vital Signs BP (!) 115/91   Pulse (!) 112   Temp 98.6 F (37 C) (Oral)   Resp 18 Comment: speaking in complete sentences  Ht 5\' 3"  (1.6 m)   Wt 103 kg (227 lb)   LMP 11/02/2017   SpO2 100%   BMI 40.21 kg/m   Physical Exam  Constitutional: She appears well-developed and well-nourished.  HENT:  Head: Normocephalic and atraumatic.  Right Ear: Tympanic membrane and  external ear normal.  Left Ear: Tympanic membrane and external ear normal.  Nose: Mucosal edema present. Right sinus exhibits no maxillary sinus tenderness and no frontal sinus tenderness. Left sinus exhibits no maxillary sinus tenderness and no frontal sinus tenderness.  Mouth/Throat: Uvula is midline, oropharynx is clear and moist and mucous membranes are normal. No tonsillar exudate.  The patient has normal phonation and is in control of secretions. No stridor.  Midline uvula without edema. Soft palate rises symmetrically.  No tonsillar erythema or exudates. No PTA. Tongue protrusion is normal. No trismus. No creptius on neck palpation and patient has good dentition. No gingival erythema or fluctuance noted. Mucus membranes moist.   Eyes: Pupils are equal, round, and reactive to light. Right eye exhibits no discharge. Left eye exhibits no discharge. No scleral icterus.  Neck: Trachea normal. Neck supple. No spinous process tenderness present. No neck rigidity. Normal range of motion present.  No nuchal rigidity or meningismus. No JVD  Cardiovascular: Regular rhythm and intact distal pulses. Tachycardia present.  No murmur heard. Pulses:      Radial pulses are 2+ on the right side, and 2+ on the left side.       Dorsalis pedis pulses are 2+ on the right side, and 2+ on the left side.       Posterior tibial pulses are 2+ on the right side, and 2+ on the left side.  No lower extremity swelling or edema. Calves symmetric in size bilaterally.  Pulmonary/Chest: Effort normal. No accessory muscle usage. No tachypnea. No respiratory distress. She has wheezes (global, expiratory). She exhibits no tenderness.  Patient sating at 98% on room air with good waveform on monitor. No increased work of breathing. No accessory muscle use. Patient is sitting upright, speaking in full sentences without difficulty   Abdominal: Soft. Bowel sounds are normal. There is no tenderness. There is no rebound and no guarding.    Musculoskeletal: She exhibits no edema.  Lymphadenopathy:    She has no cervical adenopathy.  Neurological: She is alert.  Skin: Skin is warm and dry. Capillary refill takes less than 2 seconds. No rash noted. She is not diaphoretic.  Psychiatric: She has a normal mood and affect.  Nursing note and vitals reviewed.    ED Treatments / Results  Labs (all labs ordered are listed, but only abnormal results are displayed) Labs Reviewed  BASIC METABOLIC PANEL - Abnormal; Notable for the following components:      Result Value   Potassium 3.4 (*)  Glucose, Bld 137 (*)    Calcium 8.7 (*)    All other components within normal limits  CBC WITH DIFFERENTIAL/PLATELET - Abnormal; Notable for the following components:   Hemoglobin 11.0 (*)    HCT 33.9 (*)    MCH 25.9 (*)    All other components within normal limits  D-DIMER, QUANTITATIVE (NOT AT [facility]) - Abnormal; Notable for the following components:   D-Dimer, Quant 0.61 (*)    All other components within normal limits  TROPONIN I    EKG EKG Interpretation  Date/Time:  Tuesday November 09 2017 16:00:49 EDT Ventricular Rate:  111 PR Interval:    QRS Duration: 104 QT Interval:  330 QTC Calculation: 449 R Axis:   33 Text Interpretation:  Sinus tachycardia Low voltage, precordial leads No prior ECG for comaprison.  No STEMI Confirmed by Theda Belfast ([id number]) on 11/09/2017 4:43:51 PM   Radiology Dg Chest 2 View  Result Date: 11/09/2017 CLINICAL DATA:  Cough and congestion for 3 weeks. EXAM: CHEST - 2 VIEW COMPARISON:  Chest radiograph August 20, 2014 FINDINGS: Cardiomediastinal silhouette is normal. No pleural effusions or focal consolidations. Trachea projects midline and there is no pneumothorax. Soft tissue planes and included osseous structures are non-suspicious. Mild degenerative change of the thoracic spine, scoliosis. Surgical clips in the included right abdomen compatible with cholecystectomy. IMPRESSION: Negative. Electronically  Signed   By: Awilda Metro M.D.   On: 11/09/2017 15:43   Ct Angio Chest Pe W/cm &/or Wo Cm  Result Date: 11/09/2017 CLINICAL DATA:  Cough and shortness of breath for 2 weeks. Abnormal D-dimer level. EXAM: CT ANGIOGRAPHY CHEST WITH CONTRAST TECHNIQUE: Multidetector CT imaging of the chest was performed using the standard protocol during bolus administration of intravenous contrast. Multiplanar CT image reconstructions and MIPs were obtained to evaluate the vascular anatomy. CONTRAST:  ISOVUE-370 IOPAMIDOL (ISOVUE-370) INJECTION 76% COMPARISON:  Chest radiograph 11/09/2017 FINDINGS: Cardiovascular: No filling defect is identified in the pulmonary arterial tree to suggest pulmonary embolus. Mediastinum/Nodes: Small to moderate-sized type 1 hiatal hernia. Right paratracheal node 0.8 cm in short axis on image 14/5. No overtly pathologic adenopathy identified in the chest. Lungs/Pleura: Mild bilateral central airway thickening most notable in the lower lobes. Upper Abdomen: Cholecystectomy.  Colonic diverticulosis. Musculoskeletal: Mild thoracic spondylosis. Mild dextroconvex thoracic scoliosis. Review of the MIP images confirms the above findings. IMPRESSION: 1. No filling defect is identified in the pulmonary arterial tree to suggest pulmonary embolus. 2. Small to moderate-sized type 1 hiatal hernia. 3. Airway thickening is present, suggesting bronchitis or reactive airways disease. 4. Upper colonic diverticulosis. Electronically Signed   By: Gaylyn Rong M.D.   On: 11/09/2017 19:28    Procedures Procedures (including critical care time)  Medications Ordered in ED Medications  ipratropium-albuterol (DUONEB) 0.5-2.5 (3) MG/3ML nebulizer solution 3 mL (3 mLs Nebulization Given 11/09/17 1546)  predniSONE (DELTASONE) tablet 60 mg (60 mg Oral Given 11/09/17 1555)  ipratropium-albuterol (DUONEB) 0.5-2.5 (3) MG/3ML nebulizer solution 3 mL (3 mLs Nebulization Given 11/09/17 1656)  sodium chloride 0.9  % bolus 1,000 mL (0 mLs Intravenous Stopped 11/09/17 1854)  iopamidol (ISOVUE-370) 76 % injection 100 mL (100 mLs Intravenous Contrast Given 11/09/17 1907)     Initial Impression / Assessment and Plan / ED Course  I have reviewed the triage vital signs and the nursing notes.  Pertinent labs & imaging results that were available during my care of the patient were reviewed by me and considered in my medical decision making (see chart for  details).     37 year old female the presents with cough and shortness of breath.  Patient reports that she had a upper respiratory infection 2 weeks ago was seen at urgent care with a negative x-ray.  She was then told she had bronchitis and was prescribed albuterol inhaler but has not been using it.  She did not receive any steroids.  This morning when she was ambulating she became short of breath was relieved with rest.  She denies any associated chest pain.  She was seen at urgent care and sent over for further evaluation.  On arrival the patient is without fever, tachypnea, hypoxia or hypotension.  She is non-ill and nonseptic appearing.  No increased work of breathing.  She is noted to be tachycardic.  Patient does have expiratory wheezes globally.  Will give steroids and DuoNeb and reassess.  Patient is not on ACE inhibitor because ACE inhibitor cough.  After discussing with my attending Dr. Rush Landmarkegeler, as well as with the patient on obtaining labs or not, joint decision has been made to obtain labs including d-dimer given patient's persistent tachycardia.  Patient without other risk factors for PE at this time.  Chest x-ray without evidence of active cardia pulmonary disease.  Lab work is reassuring.  Patient without leukocytosis.  Is within normal limits.  EKG is with sinus tachycardia.  No STEMI.  Symptoms atypical for ACS.  D-dimer was elevated at 0.61.  CTA was obtained and shows no evidence of PE.  There was area thickening that was consistent with bronchitis.  No evidence of PNA.   Patient symptoms improved after DuoNeb therapy.  Patient was ambulated in the department without evidence of hypoxia, tachypnea or increased work of breathing/shortness of breath.  She feels her breathing is back at baseline.  Will discharge the patient home with albuterol, steroid burst and Tessalon as needed for cough. I advised the patient to follow-up with PCP this week. Specific return precautions discussed. Time was given for all questions to be answered. The patient verbalized understanding and agreement with plan. The patient appears safe for discharge home.  Final Clinical Impressions(s) / ED Diagnoses   Final diagnoses:  Bronchitis  Shortness of breath  Cough    ED Discharge Orders        Ordered    predniSONE (DELTASONE) 20 MG tablet  Daily     11/09/17 2019    albuterol (PROVENTIL) (2.5 MG/3ML) 0.083% nebulizer solution  Every 6 hours PRN     11/09/17 2019    benzonatate (TESSALON) 100 MG capsule  Every 8 hours     11/09/17 2019       Princella PellegriniMaczis, Michael M, PA-C 11/10/17 0027    Tegeler, Canary Brimhristopher J, MD 11/10/17 [phone]

## 2017-11-09 NOTE — ED Triage Notes (Signed)
Cough with SOB x 2 weeks. Seen by UC x 2, had neg xray. Given inhaler. Pt states cough persists.

## 2018-03-06 ENCOUNTER — Emergency Department (HOSPITAL_COMMUNITY)
Admission: EM | Admit: 2018-03-06 | Discharge: 2018-03-06 | Disposition: A | Discharge status: Home / Self Care | Payer: Medicaid Other | Attending: Emergency Medicine | Admitting: Emergency Medicine

## 2018-03-06 ENCOUNTER — Other Ambulatory Visit: Payer: Self-pay

## 2018-03-06 ENCOUNTER — Encounter (HOSPITAL_COMMUNITY): Payer: Self-pay | Admitting: Emergency Medicine

## 2018-03-06 DIAGNOSIS — M5442 Lumbago with sciatica, left side: Secondary | ICD-10-CM

## 2018-03-06 DIAGNOSIS — R200 Anesthesia of skin: Secondary | ICD-10-CM | POA: Diagnosis not present

## 2018-03-06 DIAGNOSIS — Z79899 Other long term (current) drug therapy: Secondary | ICD-10-CM | POA: Insufficient documentation

## 2018-03-06 DIAGNOSIS — R202 Paresthesia of skin: Secondary | ICD-10-CM | POA: Diagnosis not present

## 2018-03-06 DIAGNOSIS — M545 Low back pain: Secondary | ICD-10-CM | POA: Diagnosis present

## 2018-03-06 MED ORDER — PREDNISONE 10 MG (21) PO TBPK: ORAL_TABLET | Freq: Every day | ORAL | 0 refills | Status: DC

## 2018-03-06 MED ORDER — CYCLOBENZAPRINE HCL 10 MG PO TABS: 10.0000 mg | ORAL_TABLET | Freq: Two times a day (BID) | ORAL | 0 refills | Status: DC | PRN

## 2018-03-06 NOTE — ED Triage Notes (Signed)
Patient c/o lower back pain x5 days. Reports intermittent pain radiating down left leg with tingling. Denies injury. Ambulatory.

## 2018-03-06 NOTE — ED Provider Notes (Signed)
[facility]-EMERGENCY DEPT Provider Note   CSN: [id number] Arrival date & time: 03/06/18  1332     History   Chief Complaint Chief Complaint  Patient presents with  . Back Pain  . Leg Pain    HPI Meredith Lucas is a 37 y.o. female presents for evaluation of acute onset, progressively worsening left-sided low back pain and left lower extremity pain for 5 days.  She states symptoms began on Tuesday.  Pain is now constant aching and throbbing and will occasionally experience shooting pains down the left side and left lower extremity.  She will also occasionally experience numbness and tingling on the left lower extremity.  Pain worsens with position changes, prolonged sitting or standing, and bending backwards.  It improves somewhat sitting upright or leaning forward.  She denies bowel or bladder incontinence, fevers, IV drug use, history of cancer, saddle paresthesias.  She has been using heat, ibuprofen, Robaxin without significant improvement in symptoms.  She did take 1 tablet of hydrocodone from her mother with temporary improvement.  Denies any recent trauma or falls, no bending, lifting, or twisting injuries.  The history is provided by the patient.    Past Medical History:  Diagnosis Date  . GERD (gastroesophageal reflux disease)     Patient Active Problem List   Diagnosis Date Noted  . OBESITY, NOS 07/29/2006  . DYSPEPSIA 07/29/2006    Past Surgical History:  Procedure Laterality Date  . CESAREAN SECTION    . TUBAL LIGATION       OB History    Gravida  4   Para  4   Term  4   Preterm      AB      Living  4     SAB      TAB      Ectopic      Multiple      Live Births  4            Home Medications    Prior to Admission medications   Medication Sig Start Date End Date Taking? Authorizing Provider  albuterol (PROVENTIL) (2.5 MG/3ML) 0.083% nebulizer solution Take 3 mLs (2.5 mg total) by nebulization every 6 (six)  hours as needed for wheezing or shortness of breath. 11/09/17   Maczis, Elmer Sow, PA-C  benzonatate (TESSALON) 100 MG capsule Take 1 capsule (100 mg total) by mouth every 8 (eight) hours. 11/09/17   Maczis, Elmer Sow, PA-C  cyclobenzaprine (FLEXERIL) 10 MG tablet Take 1 tablet (10 mg total) by mouth 2 (two) times daily as needed for muscle spasms. 03/06/18   Lowry Bala A, PA-C  ibuprofen (ADVIL,MOTRIN) 200 MG tablet Take 400 mg by mouth every 6 (six) hours as needed.    [provider]  medroxyPROGESTERone (PROVERA) 10 MG tablet Take 1 tablet (10 mg total) by mouth daily for 7 days. Patient not taking: Reported on 08/06/2017 05/05/17 05/12/17  Pincus Large, DO  meloxicam (MOBIC) 7.5 MG tablet Take 1 tablet (7.5 mg total) by mouth daily. 08/06/17   Cathie Hoops, Amy V, PA-C  predniSONE (STERAPRED UNI-PAK 21 TAB) 10 MG (21) TBPK tablet Take by mouth daily. Take 6 tabs by mouth daily  for 2 days, then 5 tabs for 2 days, then 4 tabs for 2 days, then 3 tabs for 2 days, 2 tabs for 2 days, then 1 tab by mouth daily for 2 days 03/06/18   Jeanie Sewer, PA-C    Family History Family  History  Problem Relation Age of Onset  . Cancer Mother   . Heart disease Father   . Diabetes Father   . Hypertension Father   . Cancer Sister   . Cancer Maternal Aunt   . Hypertension Paternal Grandmother   . Heart disease Paternal Grandmother     Social History Social History   Tobacco Use  . Smoking status: Never Smoker  . Smokeless tobacco: Never Used  Substance Use Topics  . Alcohol use: No  . Drug use: No     Allergies   Patient has no known allergies.   Review of Systems Review of Systems  Constitutional: Negative for chills and fever.  Gastrointestinal: Negative for abdominal pain.  Genitourinary: Negative for difficulty urinating.  Musculoskeletal: Positive for back pain.  Neurological: Positive for numbness. Negative for weakness.     Physical Exam Updated Vital Signs BP 124/82 (BP Location:  Right Arm)   Pulse 94   Temp 98.9 F (37.2 C) (Oral)   Resp 18   Ht 5\' 3"  (1.6 m)   Wt 97.5 kg   LMP 02/27/2018   SpO2 97%   BMI 38.09 kg/m   Physical Exam  Constitutional: She appears well-developed and well-nourished. No distress.  HENT:  Head: Normocephalic and atraumatic.  Eyes: Conjunctivae are normal. Right eye exhibits no discharge. Left eye exhibits no discharge.  Neck: No JVD present. No tracheal deviation present.  Cardiovascular: Normal rate and intact distal pulses.  2+DP/PT pulses bilaterally, Homans sign absent bilaterally, no lower extremity edema, no palpable cords, compartments are soft   Pulmonary/Chest: Effort normal.  Abdominal: She exhibits no distension.  Musculoskeletal: She exhibits tenderness. She exhibits no edema.       Lumbar back: She exhibits decreased range of motion, tenderness, pain and spasm. She exhibits no swelling, no edema, no deformity and no laceration.       Arms: Positive left straight leg raise.  Left-sided paralumbar muscle tenderness and spasm noted with lumbar spine tenderness at around the levels of L4-S1.  No deformity, crepitus, or step-off noted.  5/5 strength of BLE major muscle groups  Neurological: She is alert.  Fluent speech, no facial droop, sensation intact to soft touch of extremities.  Patient ambulates with an antalgic gait but able to Heel Walk and Toe Walk despite pain  Skin: Skin is warm and dry. No erythema.  Psychiatric: She has a normal mood and affect. Her behavior is normal.  Nursing note and vitals reviewed.    ED Treatments / Results  Labs (all labs ordered are listed, but only abnormal results are displayed) Labs Reviewed - No data to display  EKG None  Radiology No results found.  Procedures Procedures (including critical care time)  Medications Ordered in ED Medications - No data to display   Initial Impression / Assessment and Plan / ED Course  I have reviewed the triage vital signs and the  nursing notes.  Pertinent labs & imaging results that were available during my care of the patient were reviewed by me and considered in my medical decision making (see chart for details).     Patient with back pain.  She is afebrile, vital signs are stable.  She is nontoxic in appearance.  No neurological deficits and normal neuro exam.  Patient can walk but states is painful.  No loss of bowel or bladder control.  No concern for cauda equina.  No fever, night sweats, weight loss, h/o cancer, IVDU.  Doubt dissection.  RICE  protocol and pain medicine indicated and discussed with patient.  Will discharge with steroid taper and muscle relaxer.  Discussed appropriate use of these medications.  She has follow-up scheduled with her PCP in 1 week which I think is reasonable for follow-up.  Discussed strict ED return precautions. Pt verbalized understanding of and agreement with plan and is safe for discharge home at this time.    Final Clinical Impressions(s) / ED Diagnoses   Final diagnoses:  Acute left-sided low back pain with left-sided sciatica    ED Discharge Orders         Ordered    predniSONE (STERAPRED UNI-PAK 21 TAB) 10 MG (21) TBPK tablet  Daily     03/06/18 1421    cyclobenzaprine (FLEXERIL) 10 MG tablet  2 times daily PRN     03/06/18 1421           Xandria Gallaga, [place] A, PA-C 03/06/18 1422    Little, Ambrose Finland, MD 03/07/18 [phone]

## 2018-03-06 NOTE — Discharge Instructions (Addendum)
1. Medications: Take steroid taper as prescribed with food.  And also take [phone] mg of Tylenol every 6 hours as needed for pain. Do not exceed 4000 mg of Tylenol daily.  Avoid ibuprofen, Advil, Aleve, or Motrin while taking the steroid taper.  You can take Flexeril as needed for muscle spasm up to twice daily but do not drive, drink alcohol, or operate heavy machinery while taking this medicine because it may make you drowsy.  I typically recommend taking this medicine only at night when you are going to sleep.  You can also cut these tablets in half if they make you feel very drowsy. 2. Treatment: rest, drink plenty of fluids, gentle stretching as discussed (see attached, or can Youtube low back physical therapy exercises), alternate ice and heat (or stick with whichever feels best) 20 minutes on 20 minutes off. 3. Follow Up: Please followup with your primary doctor in 3-7 days for discussion of your diagnoses and further evaluation after today's visit; if you do not have a primary care doctor use the resource guide provided to find one;  Return to the ER for worsening back pain, difficulty walking, loss of bowel or bladder control or other concerning symptoms

## 2018-03-15 DIAGNOSIS — K219 Gastro-esophageal reflux disease without esophagitis: Secondary | ICD-10-CM | POA: Diagnosis not present

## 2018-03-15 DIAGNOSIS — Z23 Encounter for immunization: Secondary | ICD-10-CM | POA: Diagnosis not present

## 2018-03-15 DIAGNOSIS — Z6841 Body Mass Index (BMI) 40.0 and over, adult: Secondary | ICD-10-CM | POA: Diagnosis not present

## 2018-03-15 DIAGNOSIS — M545 Low back pain: Secondary | ICD-10-CM | POA: Diagnosis not present

## 2018-03-15 DIAGNOSIS — Z825 Family history of asthma and other chronic lower respiratory diseases: Secondary | ICD-10-CM | POA: Diagnosis not present

## 2018-03-15 DIAGNOSIS — Z0000 Encounter for general adult medical examination without abnormal findings: Secondary | ICD-10-CM | POA: Diagnosis not present

## 2018-03-17 DIAGNOSIS — M545 Low back pain: Secondary | ICD-10-CM | POA: Diagnosis not present

## 2018-03-17 DIAGNOSIS — E781 Pure hyperglyceridemia: Secondary | ICD-10-CM | POA: Diagnosis not present

## 2018-03-17 DIAGNOSIS — Z6841 Body Mass Index (BMI) 40.0 and over, adult: Secondary | ICD-10-CM | POA: Diagnosis not present

## 2018-03-17 DIAGNOSIS — K219 Gastro-esophageal reflux disease without esophagitis: Secondary | ICD-10-CM | POA: Diagnosis not present

## 2018-12-15 DIAGNOSIS — Z20828 Contact with and (suspected) exposure to other viral communicable diseases: Secondary | ICD-10-CM | POA: Diagnosis not present

## 2019-05-10 ENCOUNTER — Other Ambulatory Visit: Payer: Self-pay

## 2019-05-10 DIAGNOSIS — Z20822 Contact with and (suspected) exposure to covid-19: Secondary | ICD-10-CM

## 2019-05-10 DIAGNOSIS — Z20828 Contact with and (suspected) exposure to other viral communicable diseases: Secondary | ICD-10-CM | POA: Diagnosis not present

## 2019-05-11 LAB — NOVEL CORONAVIRUS, NAA: SARS-CoV-2, NAA: NOT DETECTED

## 2020-08-15 DIAGNOSIS — Z6841 Body Mass Index (BMI) 40.0 and over, adult: Secondary | ICD-10-CM | POA: Diagnosis not present

## 2020-08-15 DIAGNOSIS — R5383 Other fatigue: Secondary | ICD-10-CM | POA: Diagnosis not present

## 2020-10-27 ENCOUNTER — Other Ambulatory Visit: Payer: Self-pay

## 2020-10-27 ENCOUNTER — Encounter (HOSPITAL_COMMUNITY): Payer: Self-pay | Admitting: Emergency Medicine

## 2020-10-27 ENCOUNTER — Emergency Department (HOSPITAL_COMMUNITY): Payer: Medicaid Other

## 2020-10-27 ENCOUNTER — Emergency Department (HOSPITAL_COMMUNITY)
Admission: EM | Admit: 2020-10-27 | Discharge: 2020-10-28 | Disposition: A | Discharge status: Home / Self Care | Payer: Medicaid Other | Attending: Emergency Medicine | Admitting: Emergency Medicine

## 2020-10-27 DIAGNOSIS — G43909 Migraine, unspecified, not intractable, without status migrainosus: Secondary | ICD-10-CM | POA: Diagnosis not present

## 2020-10-27 DIAGNOSIS — G4452 New daily persistent headache (NDPH): Secondary | ICD-10-CM | POA: Diagnosis not present

## 2020-10-27 DIAGNOSIS — R42 Dizziness and giddiness: Secondary | ICD-10-CM

## 2020-10-27 DIAGNOSIS — R519 Headache, unspecified: Secondary | ICD-10-CM

## 2020-10-27 LAB — POC URINE PREG, ED: Preg Test, Ur: NEGATIVE

## 2020-10-27 MED ORDER — DEXAMETHASONE SODIUM PHOSPHATE 10 MG/ML IJ SOLN
8.0000 mg | Freq: Once | INTRAMUSCULAR | Status: AC
  Administered 2020-10-27: 8 mg via INTRAVENOUS
  Filled 2020-10-27: qty 1

## 2020-10-27 MED ORDER — SODIUM CHLORIDE 0.9 % IV BOLUS
500.0000 mL | Freq: Once | INTRAVENOUS | Status: AC
  Administered 2020-10-27: 500 mL via INTRAVENOUS

## 2020-10-27 MED ORDER — KETOROLAC TROMETHAMINE 30 MG/ML IJ SOLN
30.0000 mg | Freq: Once | INTRAMUSCULAR | Status: AC
  Administered 2020-10-27: 30 mg via INTRAVENOUS
  Filled 2020-10-27: qty 1

## 2020-10-27 MED ORDER — DIPHENHYDRAMINE HCL 50 MG/ML IJ SOLN
12.5000 mg | Freq: Once | INTRAMUSCULAR | Status: AC
  Administered 2020-10-27: 12.5 mg via INTRAVENOUS
  Filled 2020-10-27: qty 1

## 2020-10-27 MED ORDER — METOCLOPRAMIDE HCL 5 MG/ML IJ SOLN
10.0000 mg | Freq: Once | INTRAMUSCULAR | Status: AC
  Administered 2020-10-27: 10 mg via INTRAVENOUS
  Filled 2020-10-27: qty 2

## 2020-10-27 NOTE — ED Provider Notes (Signed)
Emergency Medicine Provider Triage Evaluation Note  MAEGEN WIGLE , a 40 y.o. female  was evaluated in triage.  Pt complains of headache.  Has history of migraine headache.  Over the last 4 weeks intermittent left-sided migraine that is different from usual.  Becoming more frequent, not controlled with over-the-counter medications.  Some photophobia with very bright lights.  Some intermittent lightheadedness.  No vision changes, vomiting, numbness, weakness, speech difficulty or gait instability.  Review of Systems  Positive: headache Negative: Vomiting, fever, neck pain/stiffness  Physical Exam  BP (!) 148/87 (BP Location: Right Arm)   Pulse 92   Temp 98 F (36.7 C) (Oral)   Resp 20   Ht 5\' 3"  (1.6 m)   Wt 97.5 kg   SpO2 99%   BMI 38.09 kg/m  Gen:   Awake, no distress   Resp:  Normal effort  MSK:   Moves extremities without difficulty  Other:  PERRL, 1-2beat horizontal nystagmus otherwise normal EOM. CN intact, normal finger-to-nose, steady gait, equal grip strength BUE  Medical Decision Making  Medically screening exam initiated at 9:47 PM.  Appropriate orders placed.  was informed that the remainder of the evaluation will be completed by another provider, this initial triage assessment does not replace that evaluation, and the importance of remaining in the ED until their evaluation is complete.  CT head for new features to migraine HA   Rondell Frick, [place] N, PA-C 10/27/20 2155    2156, MD 10/27/20 2157

## 2020-10-27 NOTE — ED Provider Notes (Signed)
[facility]-EMERGENCY DEPT Provider Note   CSN: [id number] Arrival date & time: 10/27/20  2134     History Chief Complaint  Patient presents with  . Headache  . Dizziness    Light-headed    Meredith Lucas is a 40 y.o. female with PMHx GERD, migraine HA, presenting for evaluation of headache.  She has history of migraine headache that are generally infrequent in occurrence.  She states over the last 4 weeks she has been having intermittent left sided headache, as well as bilateral occipital headache.  Pain waxes and wanes, comes and goes.  It is not relieved with ibuprofen or Tylenol like it usually is.  She also tried Sudafed wondering if she should try to treat a potential sinus problem.  She has some mild photophobia only with very bright lights.  No vomiting, vision changes, weakness, speech difficulty or gait instability.  She also reports a few episodes of near syncope with standing over the last 2 days.  No associated palpitations, chest pain, shortness of breath.  No bleeding or bowel changes.  No neck pain or stiffness.  No fevers.  The history is provided by the patient.       Past Medical History:  Diagnosis Date  . GERD (gastroesophageal reflux disease)     Patient Active Problem List   Diagnosis Date Noted  . OBESITY, NOS 07/29/2006  . DYSPEPSIA 07/29/2006    Past Surgical History:  Procedure Laterality Date  . CESAREAN SECTION    . TUBAL LIGATION       OB History    Gravida  4   Para  4   Term  4   Preterm      AB      Living  4     SAB      IAB      Ectopic      Multiple      Live Births  4           Family History  Problem Relation Age of Onset  . Cancer Mother   . Heart disease Father   . Diabetes Father   . Hypertension Father   . Cancer Sister   . Cancer Maternal Aunt   . Hypertension Paternal Grandmother   . Heart disease Paternal Grandmother     Social History   Tobacco Use  . Smoking  status: Never Smoker  . Smokeless tobacco: Never Used  Substance Use Topics  . Alcohol use: No  . Drug use: No    Home Medications Prior to Admission medications   Medication Sig Start Date End Date Taking? Authorizing Provider  albuterol (PROVENTIL) (2.5 MG/3ML) 0.083% nebulizer solution Take 3 mLs (2.5 mg total) by nebulization every 6 (six) hours as needed for wheezing or shortness of breath. 11/09/17   Maczis, Elmer Sow, PA-C  benzonatate (TESSALON) 100 MG capsule Take 1 capsule (100 mg total) by mouth every 8 (eight) hours. 11/09/17   Maczis, Elmer Sow, PA-C  cyclobenzaprine (FLEXERIL) 10 MG tablet Take 1 tablet (10 mg total) by mouth 2 (two) times daily as needed for muscle spasms. 03/06/18   Fawze, Mina A, PA-C  ibuprofen (ADVIL,MOTRIN) 200 MG tablet Take 400 mg by mouth every 6 (six) hours as needed.    [provider]  medroxyPROGESTERone (PROVERA) 10 MG tablet Take 1 tablet (10 mg total) by mouth daily for 7 days. Patient not taking: Reported on 08/06/2017 05/05/17 05/12/17  Pincus Large, DO  meloxicam (MOBIC) 7.5 MG tablet Take 1 tablet (7.5 mg total) by mouth daily. 08/06/17   Cathie Hoops, Amy V, PA-C  predniSONE (STERAPRED UNI-PAK 21 TAB) 10 MG (21) TBPK tablet Take by mouth daily. Take 6 tabs by mouth daily  for 2 days, then 5 tabs for 2 days, then 4 tabs for 2 days, then 3 tabs for 2 days, 2 tabs for 2 days, then 1 tab by mouth daily for 2 days 03/06/18   Michela Pitcher A, PA-C    Allergies    Patient has no known allergies.  Review of Systems   Review of Systems  All other systems reviewed and are negative.   Physical Exam Updated Vital Signs BP 115/73   Pulse 74   Temp 98 F (36.7 C) (Oral)   Resp 18   Ht 5\' 3"  (1.6 m)   Wt 97.5 kg   SpO2 99%   BMI 38.09 kg/m   Physical Exam Vitals and nursing note reviewed.  Constitutional:      General: She is not in acute distress.    Appearance: She is well-developed. She is not ill-appearing.  HENT:     Head: Normocephalic  and atraumatic.  Eyes:     Extraocular Movements: Extraocular movements intact.     Conjunctiva/sclera: Conjunctivae normal.     Pupils: Pupils are equal, round, and reactive to light.  Cardiovascular:     Rate and Rhythm: Normal rate and regular rhythm.  Pulmonary:     Effort: Pulmonary effort is normal. No respiratory distress.     Breath sounds: Normal breath sounds.  Abdominal:     General: Bowel sounds are normal.     Palpations: Abdomen is soft.  Musculoskeletal:     Cervical back: Normal range of motion.  Skin:    General: Skin is warm.  Neurological:     Mental Status: She is alert.     Comments: No cranial nerve deficits.  Normal finger-nose bilaterally.  Normal tone.  Strong and equal grip strength bilateral upper extremities, steady gait.  Psychiatric:        Behavior: Behavior normal.     ED Results / Procedures / Treatments   Labs (all labs ordered are listed, but only abnormal results are displayed) Labs Reviewed  BASIC METABOLIC PANEL  CBC WITH DIFFERENTIAL/PLATELET  POC URINE PREG, ED    EKG None  Radiology CT Head Wo Contrast  Result Date: 10/27/2020 CLINICAL DATA:  History of migraine headaches, intermittent headache for 4 weeks EXAM: CT HEAD WITHOUT CONTRAST TECHNIQUE: Contiguous axial images were obtained from the base of the skull through the vertex without intravenous contrast. COMPARISON:  11/30/2009 FINDINGS: Brain: No acute infarct or hemorrhage. Lateral ventricles and midline structures are unremarkable. No acute extra-axial fluid collections. No mass effect. Vascular: No hyperdense vessel or unexpected calcification. Skull: Normal. Negative for fracture or focal lesion. Sinuses/Orbits: Mucosal thickening of the ethmoid and frontal sinuses. Small gas fluid level within the right maxillary sinus. Other: None. IMPRESSION: 1. Right maxillary sinusitis, with mild ethmoid and frontal sinus mucosal thickening. 2. No acute intracranial process.  Electronically Signed   By: 01/31/2010 M.D.   On: 10/27/2020 22:32    Procedures Procedures   Medications Ordered in ED Medications  metoCLOPramide (REGLAN) injection 10 mg (10 mg Intravenous Given 10/27/20 2312)  ketorolac (TORADOL) 30 MG/ML injection 30 mg (30 mg Intravenous Given 10/27/20 2312)  diphenhydrAMINE (BENADRYL) injection 12.5 mg (12.5 mg Intravenous Given 10/27/20 2311)  sodium chloride 0.9 %  bolus 500 mL (500 mLs Intravenous New Bag/Given 10/27/20 2318)  dexamethasone (DECADRON) injection 8 mg (8 mg Intravenous Given 10/27/20 2312)    ED Course  I have reviewed the triage vital signs and the nursing notes.  Pertinent labs & imaging results that were available during my care of the patient were reviewed by me and considered in my medical decision making (see chart for details).    MDM Rules/Calculators/A&P                          Patient with hx migraine HA, presenting for 4 weeks of worsening HA, changing in quality and frequency. NO focal neuro deficits.  No fevers, neck pain or rigidity, presentation consistent with meningitis.  No clotting risk factors.  She is well-appearing and in no distress.  CT head is negative for acute findings.  Regarding her few episodes of lightheadedness with standing today, CBC and metabolic panel ordered, EKG as well.  Migraine cocktail ordered for symptom relief.    Care handed off at shift change to [employer] pending blood work.  She is ambulating with steady gait in the ED.  If no significant acute findings, anticipate discharge to home with neurology referral.  Final Clinical Impression(s) / ED Diagnoses Final diagnoses:  None    Rx / DC Orders ED Discharge Orders    None       Landyn Buckalew, [place] N, PA-C 10/28/20 0010    Koleen Distance, MD 10/28/20 2038

## 2020-10-27 NOTE — ED Triage Notes (Signed)
Patient states history of migraines, which rarely occur. Patient states 4 weeks ago she began having an intermittent headache that was all over her head and throbbing. Patient states that over the next couple of weeks the headache became constant and OTC medications are no longer working. Patient states intermittent light-headedness over the past week. Patient has equal grip and is neurologically intact.

## 2020-10-27 NOTE — ED Provider Notes (Signed)
40 year old female received a signout from [facility] [place] pending labs.  Per her HPI:  "Meredith Lucas is a 40 y.o. female with PMHx GERD, migraine HA, presenting for evaluation of headache.  She has history of migraine headache that are generally infrequent in occurrence.  She states over the last 4 weeks she has been having intermittent left sided headache, as well as bilateral occipital headache.  Pain waxes and wanes, comes and goes.  It is not relieved with ibuprofen or Tylenol like it usually is.  She also tried Sudafed wondering if she should try to treat a potential sinus problem.  She has some mild photophobia only with very bright lights.  No vomiting, vision changes, weakness, speech difficulty or gait instability.  She also reports a few episodes of near syncope with standing over the last 2 days.  No associated palpitations, chest pain, shortness of breath.  No bleeding or bowel changes.  No neck pain or stiffness.  No fevers.  The history is provided by the patient." Physical Exam  BP (!) 137/98   Pulse 77   Temp 98 F (36.7 C) (Oral)   Resp (!) 22   Ht 5\' 3"  (1.6 m)   Wt 97.5 kg   SpO2 100%   BMI 38.09 kg/m   Physical Exam Vitals and nursing note reviewed.  Constitutional:      Appearance: She is well-developed.     Comments: No acute distress.  Resting comfortably in bed.  HENT:     Head: Normocephalic and atraumatic.  Cardiovascular:     Rate and Rhythm: Normal rate.  Pulmonary:     Effort: Pulmonary effort is normal.  Musculoskeletal:        General: Normal range of motion.     Cervical back: Normal range of motion.  Neurological:     Mental Status: She is alert and oriented to person, place, and time.     ED Course/Procedures     Procedures  MDM  40 year old female with a history of GERD, migraine headaches received at signout from [facility] pending labs and reevaluation.  In brief, this is a patient with a history of chronic headaches who has had an  increase in frequency of headaches over the last month.  She came for evaluation today after she had a near syncopal episode and has been endorsing lightheadedness with standing.  Please see PA [facility]'s note for further work-up and medical decision making.  Vital signs are normal.  Labs have been reviewed and independently interpreted by me.  Hemoglobin is 10.1, within normal limits of change from last hemoglobin in 2019.  No metabolic derangements.  Pregnancy test is negative.   CT head was previously reviewed by PA 2020.  Demonstrating right maxillary sinusitis with mild ethmoid and frontal sinus thickening.  However, the patient's headache and symptoms have been on the left side.  I suspect this is an incidental finding and is not contributing to her symptoms.  However, treatment would not change as symptoms are likely viral and management would be supportive.  On my evaluation, patient's headache has resolved.  She has ambulated and has no lightheadedness.  She reports that she is feeling much improved and would like to be discharged.  She will call and schedule follow-up appointment with her PCP if headaches return.  ER return precautions discussed.  She is hemodynamically stable and in no acute distress.  Safe for discharge to home with outpatient follow-up as needed.    Roxan Hockey  A, PA-C 10/28/20 0215    Melene Plan, DO 10/28/20 [phone]

## 2020-10-28 DIAGNOSIS — G4452 New daily persistent headache (NDPH): Secondary | ICD-10-CM | POA: Insufficient documentation

## 2020-10-28 LAB — CBC WITH DIFFERENTIAL/PLATELET
Abs Immature Granulocytes: 0.03 10*3/uL (ref 0.00–0.07)
Basophils Absolute: 0.1 10*3/uL (ref 0.0–0.1)
Basophils Relative: 1 %
Eosinophils Absolute: 0.3 10*3/uL (ref 0.0–0.5)
Eosinophils Relative: 3 %
HCT: 33.5 % — ABNORMAL LOW (ref 36.0–46.0)
Hemoglobin: 10.1 g/dL — ABNORMAL LOW (ref 12.0–15.0)
Immature Granulocytes: 0 %
Lymphocytes Relative: 41 %
Lymphs Abs: 3.1 10*3/uL (ref 0.7–4.0)
MCH: 23.8 pg — ABNORMAL LOW (ref 26.0–34.0)
MCHC: 30.1 g/dL (ref 30.0–36.0)
MCV: 79 fL — ABNORMAL LOW (ref 80.0–100.0)
Monocytes Absolute: 0.6 10*3/uL (ref 0.1–1.0)
Monocytes Relative: 8 %
Neutro Abs: 3.6 10*3/uL (ref 1.7–7.7)
Neutrophils Relative %: 47 %
Platelets: 322 10*3/uL (ref 150–400)
RBC: 4.24 MIL/uL (ref 3.87–5.11)
RDW: 14.9 % (ref 11.5–15.5)
WBC: 7.6 10*3/uL (ref 4.0–10.5)
nRBC: 0 % (ref 0.0–0.2)

## 2020-10-28 LAB — BASIC METABOLIC PANEL
Anion gap: 6 (ref 5–15)
BUN: 10 mg/dL (ref 6–20)
CO2: 24 mmol/L (ref 22–32)
Calcium: 8.8 mg/dL — ABNORMAL LOW (ref 8.9–10.3)
Chloride: 107 mmol/L (ref 98–111)
Creatinine, Ser: 0.67 mg/dL (ref 0.44–1.00)
GFR, Estimated: >60 mL/min (ref >60)
Glucose, Bld: 93 mg/dL (ref 70–99)
Potassium: 3.5 mmol/L (ref 3.5–5.1)
Sodium: 137 mmol/L (ref 135–145)

## 2020-10-28 NOTE — ED Notes (Signed)
Patient able to ambulate in room, to and back from the bathroom without assistance. Pt reports no dizziness or lightheadedness

## 2020-10-28 NOTE — Discharge Instructions (Signed)
Thank you for allowing me to care for you today in the Emergency Department.   Please call to schedule a follow-up appointment with your primary care clinician for recheck if your headaches return.  Make sure that you are drinking at least 64 ounces of fluids daily.  More if you are exerting yourself in the heat.  You can take Motrin and Tylenol as directed on the label for headaches.  Return to the emergency department if you lose vision in one or both eyes, if you have new numbness or weakness, if you become unable to walk, have uncontrollable vomiting, or have other new, concerning symptoms.

## 2020-10-29 ENCOUNTER — Encounter (HOSPITAL_COMMUNITY): Payer: Self-pay | Admitting: Emergency Medicine

## 2020-10-29 ENCOUNTER — Emergency Department (HOSPITAL_COMMUNITY)
Admission: EM | Admit: 2020-10-29 | Discharge: 2020-10-29 | Disposition: A | Discharge status: Home / Self Care | Payer: Medicaid Other | Source: Home / Self Care | Attending: Emergency Medicine | Admitting: Emergency Medicine

## 2020-10-29 DIAGNOSIS — G4452 New daily persistent headache (NDPH): Secondary | ICD-10-CM

## 2020-10-29 MED ORDER — PROCHLORPERAZINE MALEATE 10 MG PO TABS: 10.0000 mg | ORAL_TABLET | Freq: Two times a day (BID) | ORAL | 0 refills | Status: DC | PRN

## 2020-10-29 MED ORDER — PROCHLORPERAZINE MALEATE 10 MG PO TABS
10.0000 mg | ORAL_TABLET | Freq: Once | ORAL | Status: AC
  Administered 2020-10-29: 10 mg via ORAL
  Filled 2020-10-29: qty 1

## 2020-10-29 MED ORDER — AMOXICILLIN 500 MG PO CAPS: 500.0000 mg | ORAL_CAPSULE | Freq: Three times a day (TID) | ORAL | 0 refills | Status: AC

## 2020-10-29 NOTE — Discharge Instructions (Addendum)
You were evaluated in the Emergency Department and after careful evaluation, we did not find any emergent condition requiring admission or further testing in the hospital.  Your exam/testing today was overall reassuring.  Please take the amoxicillin antibiotic as directed to treat for possible sinus infection.  You can use the Compazine as needed for headache.  As discussed, your symptoms may be due to rebound headaches.  Try to avoid Tylenol or Motrin for the next 2 days.  Keep your follow-up with the neurologists.  Please return to the Emergency Department if you experience any worsening of your condition.  Thank you for allowing [place] to be a part of your care.

## 2020-10-29 NOTE — ED Triage Notes (Signed)
Pt reports continued headache. She was seen yesterday for same. CT scan was negative, but she states that she feels like there is a crack in her forehead.

## 2020-10-29 NOTE — ED Provider Notes (Signed)
[facility] Emergency Department Provider Note MRN:  [id number]  Arrival date & time: 10/29/20     Chief Complaint   Headache   History of Present Illness   Meredith Lucas is a 40 y.o. year-old female with a history of GERD presenting to the ED with chief complaint of headache.  Location: Left frontal Duration: 3 weeks Onset: Gradual Timing: Intermittent but becoming more constant Description: Dull ache Severity: Moderate Exacerbating/Alleviating Factors: Worse with palpating the forehead, worse with bright lights, worse with loud noises Associated Symptoms: None Pertinent Negatives: Denies vision change, no trouble speaking, no trouble swallowing, no neck pain or stiffness, no fever, no chest pain or shortness of breath, no abdominal pain, no numbness or weakness to the arms or legs.   Review of Systems  A complete 10 system review of systems was obtained and all systems are negative except as noted in the HPI and PMH.   Patient's Health History    Past Medical History:  Diagnosis Date  . GERD (gastroesophageal reflux disease)     Past Surgical History:  Procedure Laterality Date  . CESAREAN SECTION    . TUBAL LIGATION      Family History  Problem Relation Age of Onset  . Cancer Mother   . Heart disease Father   . Diabetes Father   . Hypertension Father   . Cancer Sister   . Cancer Maternal Aunt   . Hypertension Paternal Grandmother   . Heart disease Paternal Grandmother     Social History   Socioeconomic History  . Marital status: Married    Spouse name: Not on file  . Number of children: Not on file  . Years of education: Not on file  . Highest education level: Not on file  Occupational History  . Not on file  Tobacco Use  . Smoking status: Never Smoker  . Smokeless tobacco: Never Used  Substance and Sexual Activity  . Alcohol use: No  . Drug use: No  . Sexual activity: Not Currently  Other Topics Concern  . Not on  file  Social History Narrative  . Not on file   Social Determinants of Health   Financial Resource Strain: Not on file  Food Insecurity: Not on file  Transportation Needs: Not on file  Physical Activity: Not on file  Stress: Not on file  Social Connections: Not on file  Intimate Partner Violence: Not on file     Physical Exam   Vitals:   10/29/20 0003 10/29/20 0013  BP: (!) 164/77 130/80  Pulse: 88 91  Resp: 18 16  Temp: 97.6 F (36.4 C) 98.1 F (36.7 C)  SpO2: 96% 99%    CONSTITUTIONAL: Well-appearing, NAD NEURO:  Alert and oriented x 3, normal and symmetric strength and sensation, normal coordination, normal speech EYES:  eyes equal and reactive ENT/NECK:  no LAD, no JVD CARDIO: Regular rate, well-perfused, normal S1 and S2 PULM:  CTAB no wheezing or rhonchi GI/GU:  normal bowel sounds, non-distended, non-tender MSK/SPINE:  No gross deformities, no edema SKIN:  no rash, atraumatic PSYCH:  Appropriate speech and behavior  *Additional and/or pertinent findings included in MDM below  Diagnostic and Interventional Summary    EKG Interpretation  Date/Time:    Ventricular Rate:    PR Interval:    QRS Duration:   QT Interval:    QTC Calculation:   R Axis:     Text Interpretation:        Labs Reviewed -  No data to display  No orders to display    Medications  prochlorperazine (COMPAZINE) tablet 10 mg (has no administration in time range)     Procedures  /  Critical Care Procedures  ED Course and Medical Decision Making  I have reviewed the triage vital signs, the nursing notes, and pertinent available records from the EMR.  Listed above are laboratory and imaging tests that I personally ordered, reviewed, and interpreted and then considered in my medical decision making (see below for details).  Continued headache without any significant red flag symptoms.  CT head yesterday reassuring.  Some evidence of sinusitis, does not quite correlate with location  of patient's pain but will try a course of amoxicillin to see if this helps.  Patient has been taking multiple doses of Tylenol and Motrin every day for the past 3 weeks, also considering rebound headaches.  The pain is not positional, neurological exam is reassuring, gradual onset, I see no benefit or indication for contrast imaging or repeat advanced imaging today.  She plans to call neurology in the morning for follow-up, appropriate for discharge.       Elmer Sow. Pilar Plate, MD [facility] Emergency Medicine [facility] [email or web address]  Final Clinical Impressions(s) / ED Diagnoses     ICD-10-CM   1. New daily persistent headache  G44.52     ED Discharge Orders         Ordered    prochlorperazine (COMPAZINE) 10 MG tablet  2 times daily PRN        10/29/20 0104    amoxicillin (AMOXIL) 500 MG capsule  3 times daily        10/29/20 0104           Discharge Instructions Discussed with and Provided to Patient:     Discharge Instructions     You were evaluated in the Emergency Department and after careful evaluation, we did not find any emergent condition requiring admission or further testing in the hospital.  Your exam/testing today was overall reassuring.  Please take the amoxicillin antibiotic as directed to treat for possible sinus infection.  You can use the Compazine as needed for headache.  As discussed, your symptoms may be due to rebound headaches.  Try to avoid Tylenol or Motrin for the next 2 days.  Keep your follow-up with the neurologists.  Please return to the Emergency Department if you experience any worsening of your condition.  Thank you for allowing [place] to be a part of your care.       Sabas Sous, MD 10/29/20 [phone]

## 2021-01-14 ENCOUNTER — Ambulatory Visit
Admission: EM | Admit: 2021-01-14 | Discharge: 2021-01-14 | Disposition: A | Discharge status: Home / Self Care | Payer: Medicaid Other | Attending: Emergency Medicine | Admitting: Emergency Medicine

## 2021-01-14 ENCOUNTER — Other Ambulatory Visit: Payer: Self-pay

## 2021-01-14 DIAGNOSIS — S161XXA Strain of muscle, fascia and tendon at neck level, initial encounter: Secondary | ICD-10-CM

## 2021-01-14 DIAGNOSIS — R519 Headache, unspecified: Secondary | ICD-10-CM | POA: Diagnosis not present

## 2021-01-14 MED ORDER — TIZANIDINE HCL 2 MG PO TABS: 2.0000 mg | ORAL_TABLET | Freq: Four times a day (QID) | ORAL | 0 refills | Status: AC | PRN

## 2021-01-14 MED ORDER — IBUPROFEN 800 MG PO TABS: 800.0000 mg | ORAL_TABLET | Freq: Three times a day (TID) | ORAL | 0 refills | Status: AC

## 2021-01-14 NOTE — Discharge Instructions (Addendum)
Use anti-inflammatories for pain/swelling. You may take up to 800 mg Ibuprofen every 8 hours with food. You may supplement Ibuprofen with Tylenol [phone] mg every 8 hours.  Supplement tizanidine at home/bedtime-will cause drowsiness, do not drive or work after taking Please follow-up if not improving or worsening
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
## 2021-11-15 IMAGING — CT CT HEAD W/O CM
3 series · 16 of 47 positions shown, 19 images · non-contrast
Comparison: 11/30/2009

CLINICAL DATA: History of migraine headaches, intermittent headache
for 4 weeks

EXAM:
CT HEAD WITHOUT CONTRAST
TECHNIQUE: Contiguous axial images were obtained from the base of the skull
through the vertex without intravenous contrast.

[Series 3: head wo · axial · 0.44mm/px · z∈[-119,+6]mm · 10 of 31 slices shown, 13 images]
[im 3/31  brain]
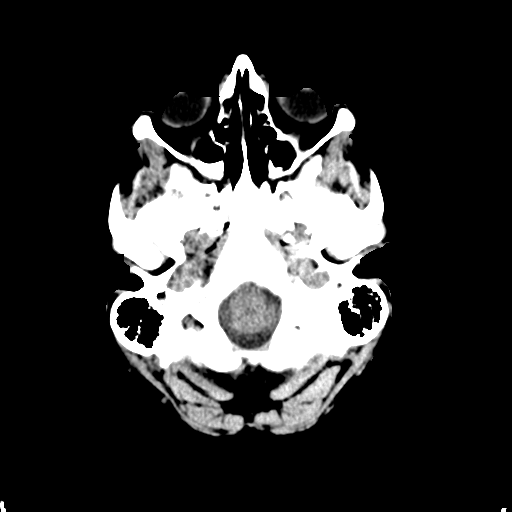
[im 3/31  bone]
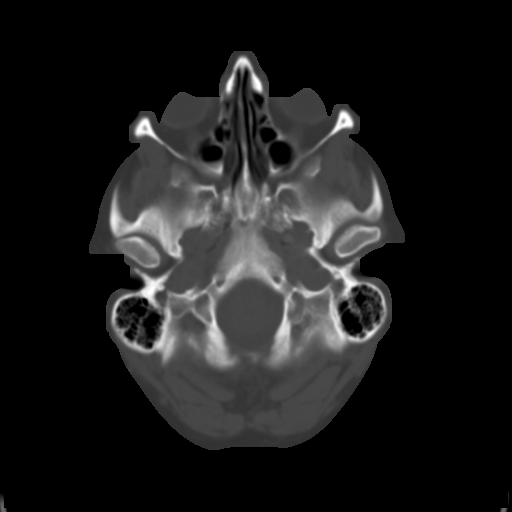
[im 6/31  brain]
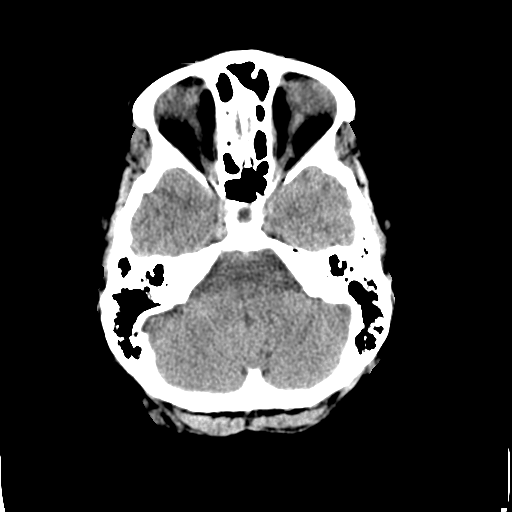
[im 9/31  brain]
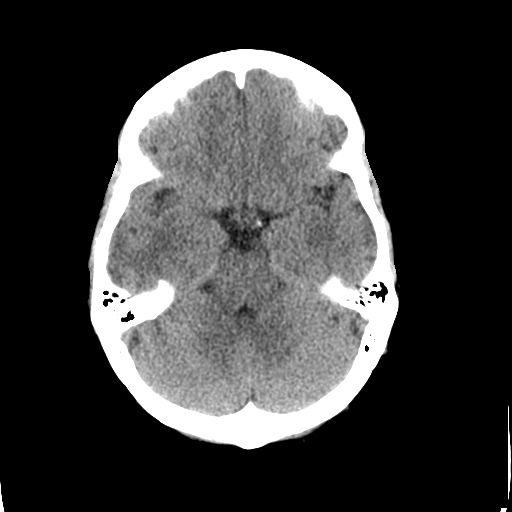
[im 11/31  brain]
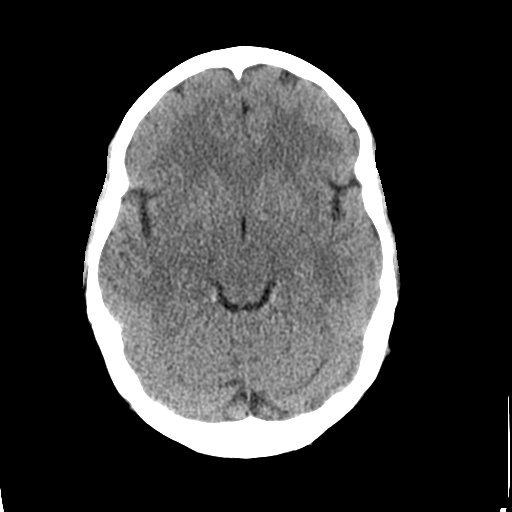
[im 14/31  brain]
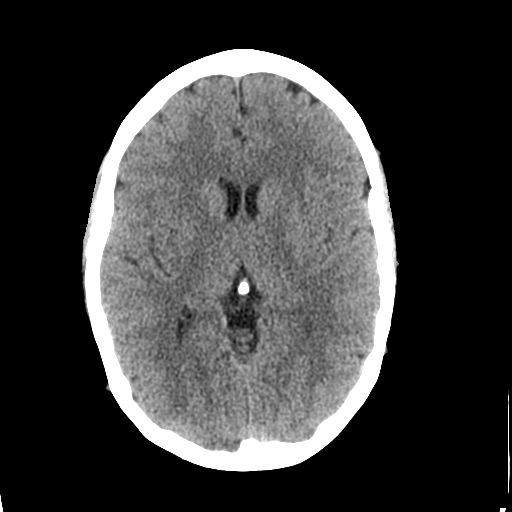
[im 14/31  bone]
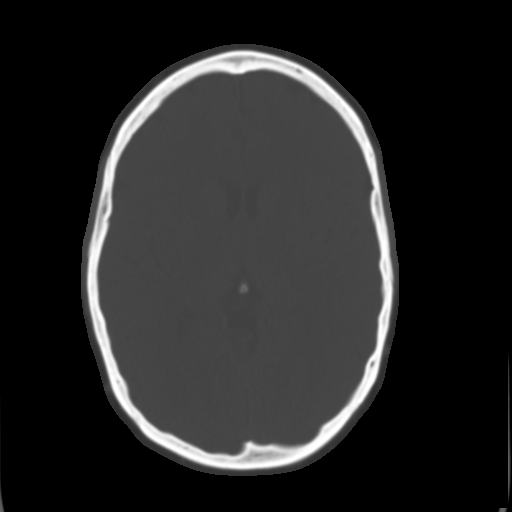
[im 17/31  brain]
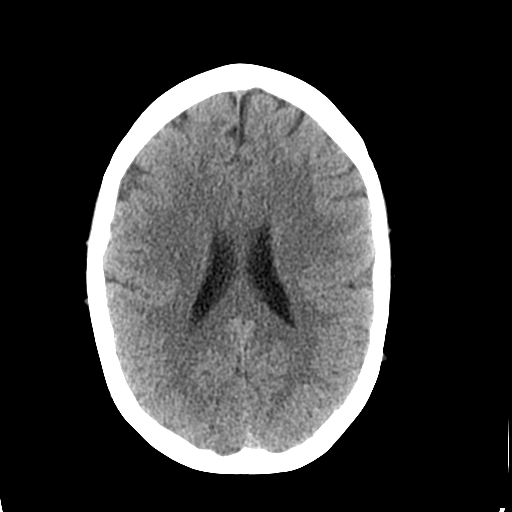
[im 20/31  brain]
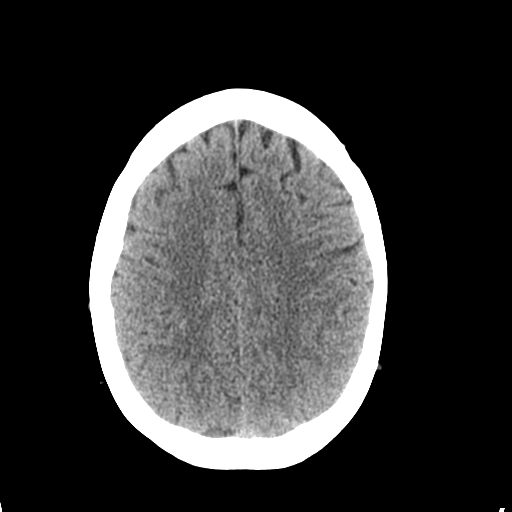
[im 23/31  brain]
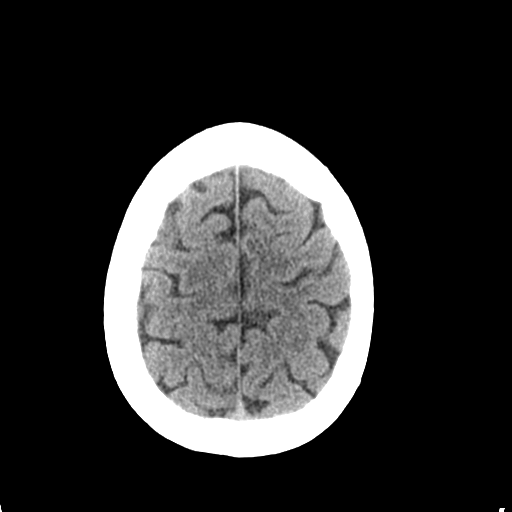
[im 25/31  brain]
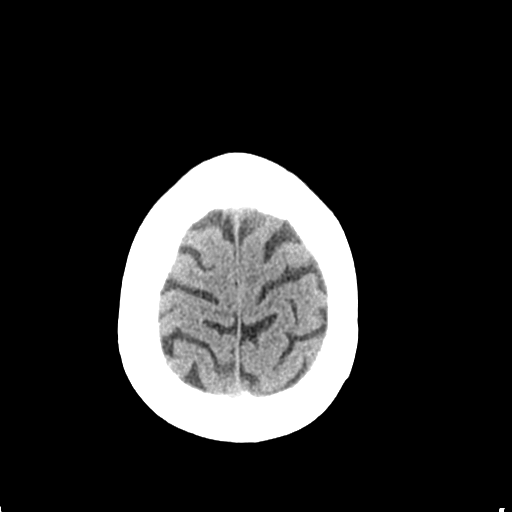
[im 25/31  bone]
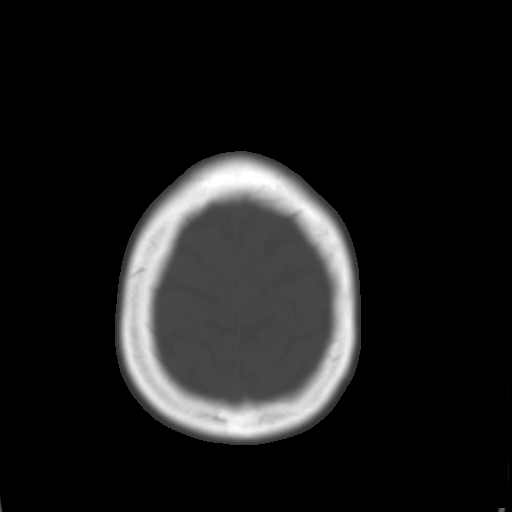
[im 28/31  brain]
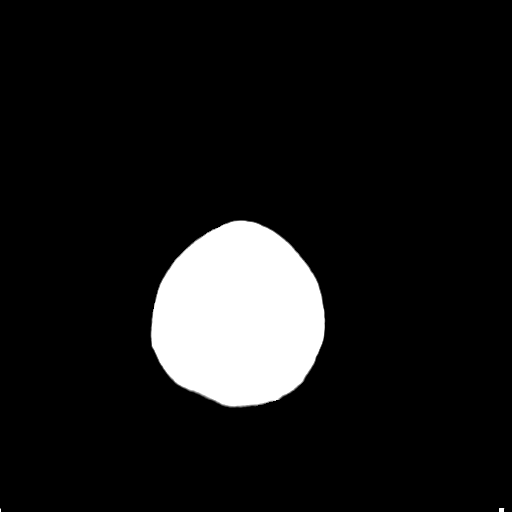

[Series 5: coronal soft tissue · coronal · 0.32mm/px · 3 of 70 slices shown]
[im 24/70  brain]
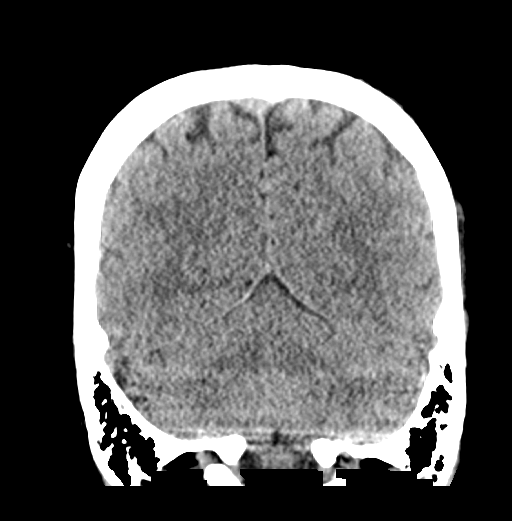
[im 31/70  brain]
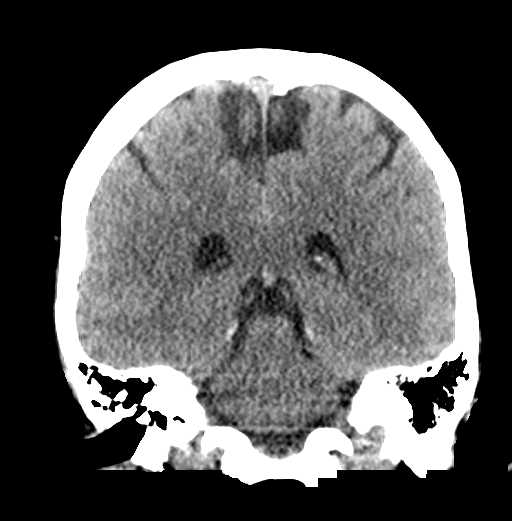
[im 39/70  brain]
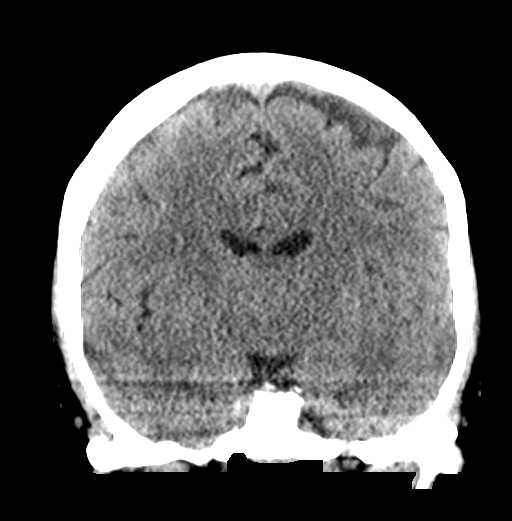

[Series 6: sagittal soft tissue · sagittal · 0.33mm/px · 3 of 56 slices shown]
[im 19/56  brain]
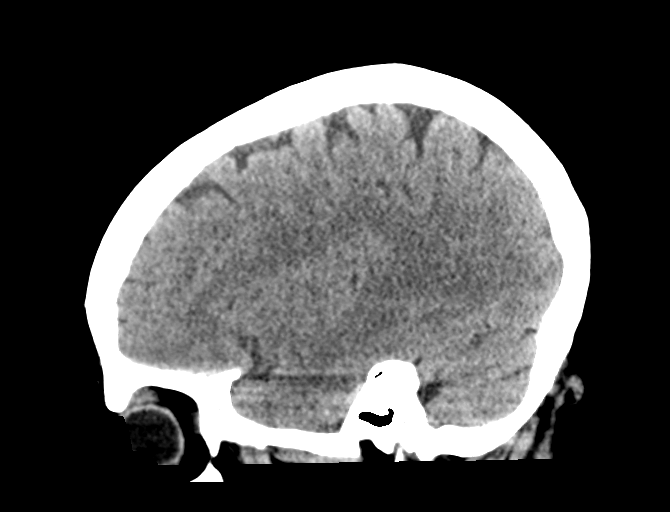
[im 28/56  brain]
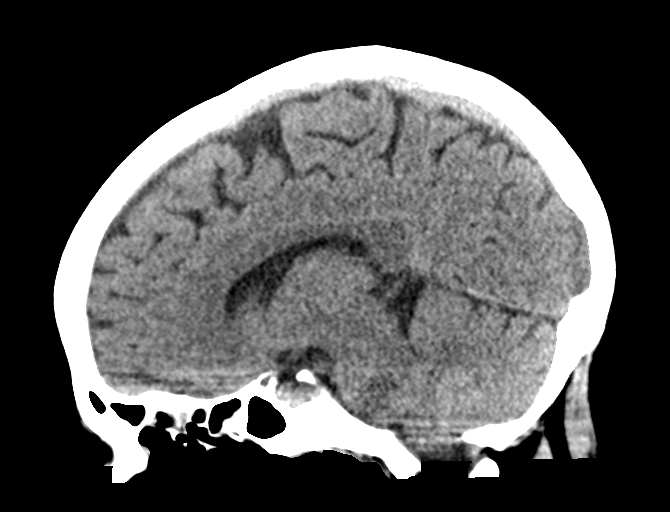
[im 37/56  brain]
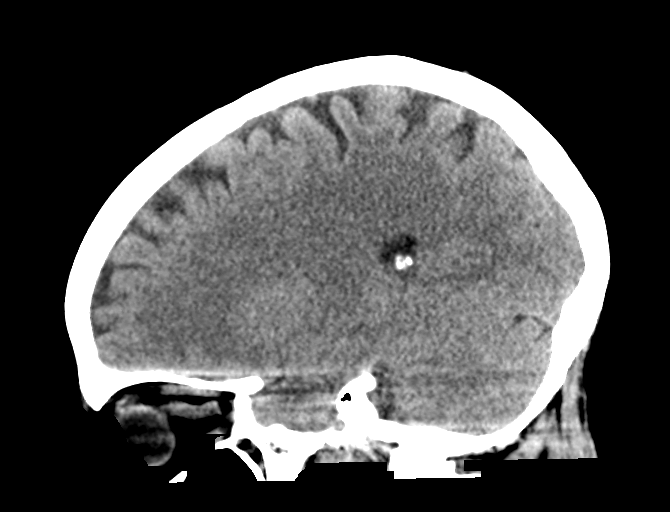

[16 of 47 positions shown; findings below may reference images not displayed]

FINDINGS: Brain: No acute infarct or hemorrhage. Lateral ventricles and
midline structures are unremarkable. No acute extra-axial fluid
collections. No mass effect.

Vascular: No hyperdense vessel or unexpected calcification.

Skull: Normal. Negative for fracture or focal lesion.

Sinuses/Orbits: Mucosal thickening of the ethmoid and frontal
sinuses. Small gas fluid level within the right maxillary sinus.

Other: None.
IMPRESSION: 1. Right maxillary sinusitis, with mild ethmoid and frontal sinus
mucosal thickening.
2. No acute intracranial process.

## 2022-06-29 ENCOUNTER — Other Ambulatory Visit: Payer: Self-pay

## 2022-06-29 ENCOUNTER — Emergency Department (HOSPITAL_BASED_OUTPATIENT_CLINIC_OR_DEPARTMENT_OTHER): Payer: Medicaid Other | Admitting: Radiology

## 2022-06-29 ENCOUNTER — Emergency Department (HOSPITAL_BASED_OUTPATIENT_CLINIC_OR_DEPARTMENT_OTHER)
Admission: EM | Admit: 2022-06-29 | Discharge: 2022-06-29 | Disposition: A | Discharge status: Home / Self Care | Payer: Medicaid Other | Attending: Emergency Medicine | Admitting: Emergency Medicine

## 2022-06-29 ENCOUNTER — Encounter (HOSPITAL_BASED_OUTPATIENT_CLINIC_OR_DEPARTMENT_OTHER): Payer: Self-pay | Admitting: Emergency Medicine

## 2022-06-29 ENCOUNTER — Emergency Department (HOSPITAL_BASED_OUTPATIENT_CLINIC_OR_DEPARTMENT_OTHER): Payer: Medicaid Other

## 2022-06-29 DIAGNOSIS — S0990XA Unspecified injury of head, initial encounter: Secondary | ICD-10-CM | POA: Diagnosis not present

## 2022-06-29 DIAGNOSIS — Y92410 Unspecified street and highway as the place of occurrence of the external cause: Secondary | ICD-10-CM | POA: Insufficient documentation

## 2022-06-29 MED ORDER — METHOCARBAMOL 500 MG PO TABS: 500.0000 mg | ORAL_TABLET | Freq: Three times a day (TID) | ORAL | 0 refills | Status: DC | PRN

## 2022-06-29 MED ORDER — NAPROXEN 500 MG PO TABS: 500.0000 mg | ORAL_TABLET | Freq: Two times a day (BID) | ORAL | 0 refills | Status: AC | PRN

## 2022-06-29 MED ORDER — ACETAMINOPHEN 325 MG PO TABS
650.0000 mg | ORAL_TABLET | Freq: Once | ORAL | Status: AC
  Administered 2022-06-29: 650 mg via ORAL
  Filled 2022-06-29: qty 2

## 2022-06-29 MED ORDER — IBUPROFEN 800 MG PO TABS
800.0000 mg | ORAL_TABLET | Freq: Once | ORAL | Status: AC
  Administered 2022-06-29: 800 mg via ORAL
  Filled 2022-06-29: qty 1

## 2022-06-29 NOTE — Discharge Instructions (Signed)
Your testing is reassuring.  Suspect you have a concussion.  Keep yourself hydrated.  Take anti-inflammatories and muscle relaxers as prescribed.  Follow-up with your doctor.  Return to the ED with new or worsening symptoms.
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
## 2022-07-09 DIAGNOSIS — F0781 Postconcussional syndrome: Secondary | ICD-10-CM | POA: Insufficient documentation

## 2022-11-13 ENCOUNTER — Encounter (HOSPITAL_BASED_OUTPATIENT_CLINIC_OR_DEPARTMENT_OTHER): Payer: Self-pay | Admitting: Emergency Medicine

## 2022-11-13 ENCOUNTER — Other Ambulatory Visit: Payer: Self-pay

## 2022-11-13 ENCOUNTER — Emergency Department (HOSPITAL_BASED_OUTPATIENT_CLINIC_OR_DEPARTMENT_OTHER)
Admission: EM | Admit: 2022-11-13 | Discharge: 2022-11-13 | Disposition: A | Discharge status: Home / Self Care | Payer: Medicaid Other | Attending: Emergency Medicine | Admitting: Emergency Medicine

## 2022-11-13 DIAGNOSIS — G44209 Tension-type headache, unspecified, not intractable: Secondary | ICD-10-CM | POA: Diagnosis not present

## 2022-11-13 DIAGNOSIS — S199XXA Unspecified injury of neck, initial encounter: Secondary | ICD-10-CM | POA: Diagnosis present

## 2022-11-13 DIAGNOSIS — Y92410 Unspecified street and highway as the place of occurrence of the external cause: Secondary | ICD-10-CM | POA: Diagnosis not present

## 2022-11-13 DIAGNOSIS — S161XXA Strain of muscle, fascia and tendon at neck level, initial encounter: Secondary | ICD-10-CM | POA: Insufficient documentation

## 2022-11-13 MED ORDER — METOCLOPRAMIDE HCL 10 MG PO TABS
10.0000 mg | ORAL_TABLET | Freq: Once | ORAL | Status: AC
  Administered 2022-11-13: 10 mg via ORAL
  Filled 2022-11-13: qty 1

## 2022-11-13 MED ORDER — KETOROLAC TROMETHAMINE 15 MG/ML IJ SOLN
15.0000 mg | Freq: Once | INTRAMUSCULAR | Status: AC
  Administered 2022-11-13: 15 mg via INTRAMUSCULAR
  Filled 2022-11-13: qty 1

## 2022-11-13 MED ORDER — DIPHENHYDRAMINE HCL 25 MG PO CAPS
25.0000 mg | ORAL_CAPSULE | Freq: Once | ORAL | Status: AC
  Administered 2022-11-13: 25 mg via ORAL
  Filled 2022-11-13: qty 1

## 2022-11-13 MED ORDER — ACETAMINOPHEN 500 MG PO TABS
1000.0000 mg | ORAL_TABLET | Freq: Once | ORAL | Status: AC
  Administered 2022-11-13: 1000 mg via ORAL
  Filled 2022-11-13: qty 2

## 2022-11-13 MED ORDER — METHOCARBAMOL 500 MG PO TABS
500.0000 mg | ORAL_TABLET | Freq: Once | ORAL | Status: AC
  Administered 2022-11-13: 500 mg via ORAL
  Filled 2022-11-13: qty 1
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
## 2022-12-02 ENCOUNTER — Ambulatory Visit
Admission: EM | Admit: 2022-12-02 | Discharge: 2022-12-02 | Disposition: A | Discharge status: Home / Self Care | Payer: Medicaid Other

## 2022-12-02 DIAGNOSIS — L089 Local infection of the skin and subcutaneous tissue, unspecified: Secondary | ICD-10-CM

## 2022-12-02 DIAGNOSIS — S91301A Unspecified open wound, right foot, initial encounter: Secondary | ICD-10-CM

## 2022-12-02 MED ORDER — CEPHALEXIN 500 MG PO CAPS: 500.0000 mg | ORAL_CAPSULE | Freq: Three times a day (TID) | ORAL | 0 refills | Status: AC

## 2022-12-02 MED ORDER — MUPIROCIN 2 % EX OINT: 1.0000 | TOPICAL_OINTMENT | Freq: Two times a day (BID) | CUTANEOUS | 0 refills | Status: AC

## 2022-12-02 NOTE — ED Provider Notes (Addendum)
[facility]    CSN: [id number] Arrival date & time: 12/02/22  1536      History   Chief Complaint Chief Complaint  Patient presents with   Insect Bite    I think I have an infection in my toe on a sore from a spider bite. It hurts really bad and feels and looks infected - Entered by patient    HPI Meredith Lucas is a 42 y.o. female.   Patient presents today with a 4-day history of painful wound on her right foot.  Reports that she was hiking in some water shoes on Saturday and when she got home she felt a slight discomfort on the lateral portion of her right foot.  Since then she has developed a small dark spot that has progressed to an ulcerative lesion.  She reports that pain is rated 5 on a 0-10 pain scale, described as burning and tingling surrounding wound, no alleviating factors identified.  She denies history of diabetes or immunosuppression.  She has cleaned the area with hydroperoxide and applied Neosporin.  She denies previous injury or surgery involving her foot.  She denies any systemic symptoms including fever, nausea, vomiting.  She had to leave work early as result of pain particularly with ambulation.  She is concerned that she was bitten by something while hiking but did not actually see anything bite her.    Past Medical History:  Diagnosis Date   GERD (gastroesophageal reflux disease)     Patient Active Problem List   Diagnosis Date Noted   Postconcussion syndrome 07/09/2022   OBESITY, NOS 07/29/2006   DYSPEPSIA 07/29/2006    Past Surgical History:  Procedure Laterality Date   CESAREAN SECTION     TUBAL LIGATION      OB History     Gravida  4   Para  4   Term  4   Preterm      AB      Living  4      SAB      IAB      Ectopic      Multiple      Live Births  4            Home Medications    Prior to Admission medications   Medication Sig Start Date End Date Taking? Authorizing Provider  cephALEXin (KEFLEX)  500 MG capsule Take 1 capsule (500 mg total) by mouth 3 (three) times daily. 12/02/22  Yes Fama Muenchow, Denny Peon K, PA-C  mupirocin ointment (BACTROBAN) 2 % Apply 1 Application topically 2 (two) times daily. 12/02/22  Yes Rajendra Spiller K, PA-C  phentermine 30 MG capsule Take 30 mg by mouth every morning. 11/17/22  Yes [provider]  ibuprofen (ADVIL) 800 MG tablet Take 1 tablet (800 mg total) by mouth 3 (three) times daily. 01/14/21   Wieters, Hallie C, PA-C  methocarbamol (ROBAXIN) 500 MG tablet Take 1 tablet (500 mg total) by mouth every 8 (eight) hours as needed for muscle spasms. 06/29/22   Rancour, Jeannett Senior, MD  naproxen (NAPROSYN) 500 MG tablet Take 1 tablet (500 mg total) by mouth 2 (two) times daily as needed. 06/29/22   Rancour, Jeannett Senior, MD  tiZANidine (ZANAFLEX) 2 MG tablet Take 1-2 tablets (2-4 mg total) by mouth every 6 (six) hours as needed for muscle spasms. 01/14/21   Wieters, Junius Creamer, PA-C    Family History Family History  Problem Relation Age of Onset   Cancer Mother  Heart disease Father    Diabetes Father    Hypertension Father    Cancer Sister    Cancer Maternal Aunt    Hypertension Paternal Grandmother    Heart disease Paternal Grandmother     Social History Social History   Tobacco Use   Smoking status: Never   Smokeless tobacco: Never  Substance Use Topics   Alcohol use: No   Drug use: No     Allergies   Patient has no known allergies.   Review of Systems Review of Systems  Constitutional:  Positive for activity change. Negative for appetite change, fatigue and fever.  Gastrointestinal:  Negative for abdominal pain, diarrhea, nausea and vomiting.  Musculoskeletal:  Positive for gait problem. Negative for arthralgias and joint swelling.  Skin:  Positive for wound. Negative for color change.  Neurological:  Negative for weakness and numbness.     Physical Exam Triage Vital Signs ED Triage Vitals  Enc Vitals Group     BP 12/02/22 1546 130/62      Pulse Rate 12/02/22 1546 95     Resp 12/02/22 1546 16     Temp 12/02/22 1546 98 F (36.7 C)     Temp Source 12/02/22 1546 Oral     SpO2 12/02/22 1546 95 %     Weight --      Height --      Head Circumference --      Peak Flow --      Pain Score 12/02/22 1550 5     Pain Loc --      Pain Edu? --      Excl. in GC? --    No data found.  Updated Vital Signs BP 130/62 (BP Location: Right Arm)   Pulse 95   Temp 98 F (36.7 C) (Oral)   Resp 16   LMP 11/18/2022 (Approximate)   SpO2 95%   Visual Acuity Right Eye Distance:   Left Eye Distance:   Bilateral Distance:    Right Eye Near:   Left Eye Near:    Bilateral Near:     Physical Exam Vitals reviewed.  Constitutional:      General: She is awake. She is not in acute distress.    Appearance: Normal appearance. She is well-developed. She is not ill-appearing.     Comments: Very pleasant female appears stated age in no acute distress sitting comfortably in exam room  HENT:     Head: Normocephalic and atraumatic.  Cardiovascular:     Rate and Rhythm: Normal rate and regular rhythm.     Heart sounds: Normal heart sounds, S1 normal and S2 normal. No murmur heard.    Comments: Capillary fill within 2 seconds right toes Pulmonary:     Effort: Pulmonary effort is normal.     Breath sounds: Normal breath sounds. No wheezing, rhonchi or rales.     Comments: Clear to auscultation bilaterally Musculoskeletal:     Right foot: Normal range of motion. No deformity.       Feet:  Feet:     Right foot:     Protective Sensation: 10 sites tested.  10 sites sensed.     Skin integrity: Ulcer present.  Psychiatric:        Behavior: Behavior is cooperative.      UC Treatments / Results  Labs (all labs ordered are listed, but only abnormal results are displayed) Labs Reviewed - No data to display  EKG   Radiology No results found.  Procedures  Procedures (including critical care time)  Medications Ordered in UC Medications  - No data to display  Initial Impression / Assessment and Plan / UC Course  I have reviewed the triage vital signs and the nursing notes.  Pertinent labs & imaging results that were available during my care of the patient were reviewed by me and considered in my medical decision making (see chart for details).     Patient is well-appearing, afebrile, nontoxic, nontachycardic.  Will cover for secondary infection given increasing pain and swelling.  She was started on cephalexin 500 mg 3 times daily for 1 week.  Recommend that she keep area clean with soap and water and apply Bactroban ointment.  Plain films were deferred as patient denies any recent trauma and has full range of motion without any point tenderness.  Patient is concerned about a brown recluse spider bite.  There were no obvious puncture wounds, necrosis, significant eschar on exam.  Did discuss that if this changes she should return for reevaluation as she may require debridement.  We discussed wound care procedures.  Recommend close follow-up with podiatry and was given contact information for local provider with instruction to call to schedule appointment.  Patient reports her last tetanus was approximately 5 to 6 years ago though her last tetanus in epic is from 2006.  Offered repeat tetanus today but she declined this.  Discussed that if she has any worsening or changing symptoms including changing wound, increasing pain, fever, nausea, vomiting she needs to be seen immediately.  Strict return precautions given.  Patient declined work excuse note.  Final Clinical Impressions(s) / UC Diagnoses   Final diagnoses:  Open wound of right foot, initial encounter  Skin infection     Discharge Instructions      Start cephalexin 3 times daily for 1 week.  Keep area clean with soap and water and apply Bactroban ointment with dressing changes.  Use Tylenol and ibuprofen for pain.  If this area changes or worsens please be seen  immediately.  Follow-up with podiatry.  If you have any worsening symptoms including enlarging lesion, increasing pain, numbness or tingling in your foot, fever, nausea, vomiting you should be seen immediately.     ED Prescriptions     Medication Sig Dispense Auth. Provider   cephALEXin (KEFLEX) 500 MG capsule Take 1 capsule (500 mg total) by mouth 3 (three) times daily. 21 capsule Hadassa Cermak K, PA-C   mupirocin ointment (BACTROBAN) 2 % Apply 1 Application topically 2 (two) times daily. 22 g Katana Berthold K, PA-C      PDMP not reviewed this encounter.   Jeani Hawking, PA-C 12/02/22 1606    Addison Freimuth, Noberto Retort, PA-C 12/02/22 1607

## 2022-12-02 NOTE — Discharge Instructions (Signed)
Start cephalexin 3 times daily for 1 week.  Keep area clean with soap and water and apply Bactroban ointment with dressing changes.  Use Tylenol and ibuprofen for pain.  If this area changes or worsens please be seen immediately.  Follow-up with podiatry.  If you have any worsening symptoms including enlarging lesion, increasing pain, numbness or tingling in your foot, fever, nausea, vomiting you should be seen immediately.

## 2022-12-02 NOTE — ED Triage Notes (Signed)
Pt presents to UC w/ c/o possible bug bite to lateral aspect of her right foot. Pt staes she was hiking on Saturday and noticed pain at the site that night. States it is painful to bear weight. Denies direct injury. Denies drainage.  Has tried neosporin and soaking foot in epsom salt.

## 2023-01-01 ENCOUNTER — Other Ambulatory Visit: Payer: Self-pay | Admitting: Physician Assistant

## 2023-01-01 DIAGNOSIS — Z1231 Encounter for screening mammogram for malignant neoplasm of breast: Secondary | ICD-10-CM

## 2023-01-11 DIAGNOSIS — Z1231 Encounter for screening mammogram for malignant neoplasm of breast: Secondary | ICD-10-CM

## 2024-03-10 ENCOUNTER — Other Ambulatory Visit: Payer: Self-pay

## 2024-03-10 ENCOUNTER — Emergency Department (HOSPITAL_BASED_OUTPATIENT_CLINIC_OR_DEPARTMENT_OTHER): Admitting: Radiology

## 2024-03-10 ENCOUNTER — Encounter (HOSPITAL_BASED_OUTPATIENT_CLINIC_OR_DEPARTMENT_OTHER): Payer: Self-pay

## 2024-03-10 ENCOUNTER — Emergency Department (HOSPITAL_BASED_OUTPATIENT_CLINIC_OR_DEPARTMENT_OTHER)
Admission: EM | Admit: 2024-03-10 | Discharge: 2024-03-10 | Disposition: A | Discharge status: Home / Self Care | Attending: Emergency Medicine | Admitting: Emergency Medicine

## 2024-03-10 DIAGNOSIS — M25512 Pain in left shoulder: Secondary | ICD-10-CM | POA: Insufficient documentation

## 2024-03-10 DIAGNOSIS — R0789 Other chest pain: Secondary | ICD-10-CM | POA: Diagnosis not present

## 2024-03-10 DIAGNOSIS — R079 Chest pain, unspecified: Secondary | ICD-10-CM

## 2024-03-10 LAB — BASIC METABOLIC PANEL WITH GFR
Anion gap: 12 (ref 5–15)
BUN: 6 mg/dL (ref 6–20)
CO2: 22 mmol/L (ref 22–32)
Calcium: 9.6 mg/dL (ref 8.9–10.3)
Chloride: 103 mmol/L (ref 98–111)
Creatinine, Ser: 0.72 mg/dL (ref 0.44–1.00)
GFR, Estimated: >60 mL/min (ref >60)
Glucose, Bld: 139 mg/dL — ABNORMAL HIGH (ref 70–99)
Potassium: 3.8 mmol/L (ref 3.5–5.1)
Sodium: 136 mmol/L (ref 135–145)

## 2024-03-10 LAB — CBC
HCT: 35.7 % — ABNORMAL LOW (ref 36.0–46.0)
Hemoglobin: 11.2 g/dL — ABNORMAL LOW (ref 12.0–15.0)
MCH: 24.6 pg — ABNORMAL LOW (ref 26.0–34.0)
MCHC: 31.4 g/dL (ref 30.0–36.0)
MCV: 78.3 fL — ABNORMAL LOW (ref 80.0–100.0)
Platelets: 342 K/uL (ref 150–400)
RBC: 4.56 MIL/uL (ref 3.87–5.11)
RDW: 16 % — ABNORMAL HIGH (ref 11.5–15.5)
WBC: 6.7 K/uL (ref 4.0–10.5)
nRBC: 0 % (ref 0.0–0.2)

## 2024-03-10 LAB — TROPONIN T, HIGH SENSITIVITY: Troponin T High Sensitivity: <15 ng/L (ref 0–19)

## 2024-03-10 LAB — PREGNANCY, URINE: Preg Test, Ur: NEGATIVE

## 2024-03-10 MED ORDER — METHOCARBAMOL 500 MG PO TABS
1000.0000 mg | ORAL_TABLET | Freq: Three times a day (TID) | ORAL | 0 refills | Status: AC | PRN
Start: 2024-03-10 — End: ?

## 2024-03-10 NOTE — Discharge Instructions (Addendum)
 As we discussed, your tests tonight do not show any indication of cardiac pain. Use Robaxin  (up to 3 times daily, including one before bed) and ibuprofen  (600 mg which is 3 over-the-counter strength tablets every 6 hours) for suspected musculoskeletal pain, heat therapy as instructed. Follow up with your doctor if pain persists, and return to the ED with any new or worsening symptoms.

## 2024-03-10 NOTE — ED Provider Notes (Signed)
 [place] EMERGENCY DEPARTMENT AT [facility] Provider Note   CSN: [id number] Arrival date & time: 03/10/24  1940     Patient presents with: Chest Pain   Meredith Lucas is a 43 y.o. female.   Patient to ED for evaluation of chest pain. She reports waking 2 mornings ago with pain in the left scapular area without known injury. This later radiated to the left upper arm and she felt she could not lift like she normally could. No numbness. No neck pain. She was sleeping on a heating pad for comfort but pain persisted. This afternoon she started having episodes of chest pain described as brief episodes of pain in the left chest that would last less than a minute and then recur. No SOB, nausea, vomiting, recent illness, fever or cough.   The history is provided by the patient. No language interpreter was used.  Chest Pain      Prior to Admission medications   Medication Sig Start Date End Date Taking? Authorizing Provider  methocarbamol  (ROBAXIN ) 500 MG tablet Take 2 tablets (1,000 mg total) by mouth every 8 (eight) hours as needed for muscle spasms. 03/10/24  Yes Odell Balls, PA-C  cephALEXin  (KEFLEX ) 500 MG capsule Take 1 capsule (500 mg total) by mouth 3 (three) times daily. 12/02/22   Raspet, Erin K, PA-C  ibuprofen  (ADVIL ) 800 MG tablet Take 1 tablet (800 mg total) by mouth 3 (three) times daily. 01/14/21   Wieters, Hallie C, PA-C  mupirocin  ointment (BACTROBAN ) 2 % Apply 1 Application topically 2 (two) times daily. 12/02/22   Raspet, Erin K, PA-C  naproxen  (NAPROSYN ) 500 MG tablet Take 1 tablet (500 mg total) by mouth 2 (two) times daily as needed. 06/29/22   Rancour, Garnette, MD  phentermine 30 MG capsule Take 30 mg by mouth every morning. 11/17/22   [provider]  tiZANidine  (ZANAFLEX ) 2 MG tablet Take 1-2 tablets (2-4 mg total) by mouth every 6 (six) hours as needed for muscle spasms. 01/14/21   Wieters, Hallie C, PA-C    Allergies: Patient has no known  allergies.    Review of Systems  Cardiovascular:  Positive for chest pain.    Updated Vital Signs BP (!) 146/93 (BP Location: Right Arm)   Pulse 99   Temp 98.4 F (36.9 C) (Oral)   Resp 18   Ht 5' 3 (1.6 m)   Wt 90.7 kg   LMP 02/25/2024   SpO2 99%   BMI 35.43 kg/m   Physical Exam Vitals and nursing note reviewed.  Constitutional:      Appearance: She is well-developed.  HENT:     Head: Normocephalic.  Cardiovascular:     Rate and Rhythm: Normal rate and regular rhythm.     Pulses: Normal pulses.     Heart sounds: No murmur heard. Pulmonary:     Effort: Pulmonary effort is normal.     Breath sounds: Normal breath sounds. No wheezing, rhonchi or rales.  Chest:     Chest wall: No tenderness.  Abdominal:     Palpations: Abdomen is soft.     Tenderness: There is no abdominal tenderness.  Musculoskeletal:        General: Normal range of motion.     Cervical back: Normal range of motion and neck supple.       Back:     Comments: Posterior left scapular area with reproducible tenderness. No swelling. FROM left arm. Equal and symmetric grip strength UE's. Increased discomfort with abduction  and adduction of shoulder without strength loss.   Skin:    General: Skin is warm and dry.  Neurological:     General: No focal deficit present.     Mental Status: She is alert and oriented to person, place, and time.     Sensory: No sensory deficit.     (all labs ordered are listed, but only abnormal results are displayed) Labs Reviewed  BASIC METABOLIC PANEL WITH GFR - Abnormal; Notable for the following components:      Result Value   Glucose, Bld 139 (*)    All other components within normal limits  CBC - Abnormal; Notable for the following components:   Hemoglobin 11.2 (*)    HCT 35.7 (*)    MCV 78.3 (*)    MCH 24.6 (*)    RDW 16.0 (*)    All other components within normal limits  PREGNANCY, URINE  TROPONIN T, HIGH SENSITIVITY  TROPONIN T, HIGH SENSITIVITY    Results for orders placed or performed during the hospital encounter of 03/10/24  Basic metabolic panel   Collection Time: 03/10/24  7:45 PM  Result Value Ref Range   Sodium 136 135 - 145 mmol/L   Potassium 3.8 3.5 - 5.1 mmol/L   Chloride 103 98 - 111 mmol/L   CO2 22 22 - 32 mmol/L   Glucose, Bld 139 (H) 70 - 99 mg/dL   BUN 6 6 - 20 mg/dL   Creatinine, Ser 9.27 0.44 - 1.00 mg/dL   Calcium 9.6 8.9 - 89.6 mg/dL   GFR, Estimated >39 >39 mL/min   Anion gap 12 5 - 15  CBC   Collection Time: 03/10/24  7:45 PM  Result Value Ref Range   WBC 6.7 4.0 - 10.5 K/uL   RBC 4.56 3.87 - 5.11 MIL/uL   Hemoglobin 11.2 (L) 12.0 - 15.0 g/dL   HCT 64.2 (L) 63.9 - 53.9 %   MCV 78.3 (L) 80.0 - 100.0 fL   MCH 24.6 (L) 26.0 - 34.0 pg   MCHC 31.4 30.0 - 36.0 g/dL   RDW 83.9 (H) 88.4 - 84.4 %   Platelets 342 150 - 400 K/uL   nRBC 0.0 0.0 - 0.2 %  Troponin T, High Sensitivity   Collection Time: 03/10/24  7:45 PM  Result Value Ref Range   Troponin T High Sensitivity <15 0 - 19 ng/L  Pregnancy, urine   Collection Time: 03/10/24  8:12 PM  Result Value Ref Range   Preg Test, Ur NEGATIVE NEGATIVE     EKG: EKG Interpretation Date/Time:  Friday March 10 2024 19:48:24 EDT Ventricular Rate:  99 PR Interval:  134 QRS Duration:  86 QT Interval:  340 QTC Calculation: 436 R Axis:   62  Text Interpretation: Normal sinus rhythm Cannot rule out Anterior infarct , age undetermined No significant change since last tracing When compared with ECG of 27-Oct-2020 23:10, PREVIOUS ECG IS PRESENT Confirmed by Doretha Folks ([id number]) on 03/10/2024 9:21:35 PM  Radiology: [facility] Chest 2 View Result Date: 03/10/2024 CLINICAL DATA:  Intermittent chest pain today. Left arm weakness for 2 days. Sharp left chest pain with breathing. EXAM: CHEST - 2 VIEW COMPARISON:  06/29/2022 FINDINGS: The heart size and mediastinal contours are within normal limits. Both lungs are clear. The visualized skeletal structures are  unremarkable. IMPRESSION: No active cardiopulmonary disease. Electronically Signed   By: Elsie Gravely M.D.   On: 03/10/2024 20:15     Procedures   Medications Ordered in the ED -  No data to display  Clinical Course as of 03/10/24 2201  Fri Mar 10, 2024  2155 Patient with posterior left shoulder and arm discomfort x 2 days, with atypical left chest discomfort today. No SOB, cough.   Heart score of 1 due to family history of premature heart disease.   CXR normal - no infiltrates. EKG NSR. Troponin negative.   Symptoms felt to be MSK type pain. Recommended robaxin , higher dose ibuprofen , PCP follow up. Return precautions discussed.  [SU]    Clinical Course User Index [SU] Odell Balls, PA-C                                 Medical Decision Making Amount and/or Complexity of Data Reviewed Labs: ordered. Radiology: ordered.        Final diagnoses:  Nonspecific chest pain  Acute pain of left shoulder    ED Discharge Orders          Ordered    methocarbamol  (ROBAXIN ) 500 MG tablet  Every 8 hours PRN        03/10/24 2200               Odell Balls, PA-C 03/10/24 2201    Doretha Folks, MD 03/13/24 2327

## 2024-03-10 NOTE — ED Triage Notes (Signed)
 Pt reports intermittent chest pain today for 30 minutes and L arm weakness x2 days.
